# Patient Record
Sex: Female | Born: 1955 | Race: White | Hispanic: No | Marital: Married | Smoking: Never smoker
Health system: Southern US, Community
[De-identification: ages and names within clinical notes are randomized; demographics above are authoritative.]

---

## 2003-04-07 ENCOUNTER — Ambulatory Visit (HOSPITAL_COMMUNITY): Admission: RE | Admit: 2003-04-07 | Discharge: 2003-04-07 | Payer: Self-pay | Admitting: Internal Medicine

## 2005-01-21 ENCOUNTER — Ambulatory Visit (HOSPITAL_COMMUNITY): Admission: RE | Admit: 2005-01-21 | Discharge: 2005-01-21 | Payer: Self-pay | Admitting: Internal Medicine

## 2014-11-22 ENCOUNTER — Other Ambulatory Visit (HOSPITAL_COMMUNITY): Payer: Self-pay | Admitting: Internal Medicine

## 2014-11-22 DIAGNOSIS — Z1231 Encounter for screening mammogram for malignant neoplasm of breast: Secondary | ICD-10-CM

## 2014-12-01 ENCOUNTER — Ambulatory Visit (HOSPITAL_COMMUNITY)
Admission: RE | Admit: 2014-12-01 | Discharge: 2014-12-01 | Disposition: A | Discharge status: Home / Self Care | Payer: BLUE CROSS/BLUE SHIELD | Source: Ambulatory Visit | Attending: Internal Medicine | Admitting: Internal Medicine

## 2014-12-01 DIAGNOSIS — Z1231 Encounter for screening mammogram for malignant neoplasm of breast: Secondary | ICD-10-CM | POA: Diagnosis not present

## 2014-12-21 ENCOUNTER — Other Ambulatory Visit (HOSPITAL_COMMUNITY): Payer: Self-pay | Admitting: Internal Medicine

## 2014-12-21 DIAGNOSIS — M8580 Other specified disorders of bone density and structure, unspecified site: Secondary | ICD-10-CM

## 2014-12-28 ENCOUNTER — Other Ambulatory Visit (HOSPITAL_COMMUNITY): Payer: BLUE CROSS/BLUE SHIELD

## 2016-01-18 ENCOUNTER — Other Ambulatory Visit (HOSPITAL_COMMUNITY): Payer: Self-pay | Admitting: Internal Medicine

## 2016-01-18 DIAGNOSIS — Z1231 Encounter for screening mammogram for malignant neoplasm of breast: Secondary | ICD-10-CM

## 2016-01-19 ENCOUNTER — Emergency Department (HOSPITAL_COMMUNITY)
Admission: EM | Admit: 2016-01-19 | Discharge: 2016-01-19 | Disposition: A | Discharge status: Home / Self Care | Payer: BLUE CROSS/BLUE SHIELD | Attending: Emergency Medicine | Admitting: Emergency Medicine

## 2016-01-19 ENCOUNTER — Emergency Department (HOSPITAL_COMMUNITY): Payer: BLUE CROSS/BLUE SHIELD

## 2016-01-19 ENCOUNTER — Encounter (HOSPITAL_COMMUNITY): Payer: Self-pay | Admitting: Emergency Medicine

## 2016-01-19 DIAGNOSIS — Y9389 Activity, other specified: Secondary | ICD-10-CM | POA: Insufficient documentation

## 2016-01-19 DIAGNOSIS — Y999 Unspecified external cause status: Secondary | ICD-10-CM | POA: Insufficient documentation

## 2016-01-19 DIAGNOSIS — Y92410 Unspecified street and highway as the place of occurrence of the external cause: Secondary | ICD-10-CM | POA: Insufficient documentation

## 2016-01-19 DIAGNOSIS — R1012 Left upper quadrant pain: Secondary | ICD-10-CM | POA: Diagnosis not present

## 2016-01-19 LAB — I-STAT CHEM 8, ED
BUN: 16 mg/dL (ref 6–20)
Calcium, Ion: 1.22 mmol/L (ref 1.12–1.23)
Chloride: 103 mmol/L (ref 101–111)
Creatinine, Ser: 0.7 mg/dL (ref 0.44–1.00)
Glucose, Bld: 104 mg/dL — ABNORMAL HIGH (ref 65–99)
HCT: 41 % (ref 36.0–46.0)
Hemoglobin: 13.9 g/dL (ref 12.0–15.0)
Potassium: 4.3 mmol/L (ref 3.5–5.1)
Sodium: 139 mmol/L (ref 135–145)
TCO2: 25 mmol/L (ref 0–100)

## 2016-01-19 MED ORDER — IOPAMIDOL (ISOVUE-300) INJECTION 61%
100.0000 mL | Freq: Once | INTRAVENOUS | Status: AC | PRN
  Administered 2016-01-19: 100 mL via INTRAVENOUS

## 2016-01-19 MED ORDER — SODIUM CHLORIDE 0.9 % IV BOLUS (SEPSIS)
500.0000 mL | Freq: Once | INTRAVENOUS | Status: AC
  Administered 2016-01-19: 500 mL via INTRAVENOUS
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
## 2016-01-19 NOTE — Discharge Instructions (Signed)
If you were given medicines take as directed.  If you are on coumadin or contraceptives realize their levels and effectiveness is altered by many different medicines.  If you have any reaction (rash, tongues swelling, other) to the medicines stop taking and see a physician.    If your blood pressure was elevated in the ER make sure you follow up for management with a primary doctor or return for chest pain, shortness of breath or stroke symptoms.  Please follow up as directed and return to the ER or see a physician for new or worsening symptoms.  Thank you. Vitals:   01/19/16 1320 01/19/16 1321  BP: 147/73   Pulse: 89   Resp: 16   Temp: 98 F (36.7 C)   TempSrc: Oral   SpO2: 100%   Weight:  170 lb (77.1 kg)  Height:  5\' 6"  (1.676 m)
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
## 2016-01-19 NOTE — ED Notes (Signed)
Patient transported to X-ray 

## 2016-01-21 ENCOUNTER — Encounter (HOSPITAL_COMMUNITY): Payer: Self-pay | Admitting: Emergency Medicine

## 2016-01-21 ENCOUNTER — Emergency Department (HOSPITAL_COMMUNITY)
Admission: EM | Admit: 2016-01-21 | Discharge: 2016-01-21 | Disposition: A | Discharge status: Home / Self Care | Payer: BLUE CROSS/BLUE SHIELD | Attending: Emergency Medicine | Admitting: Emergency Medicine

## 2016-01-21 ENCOUNTER — Emergency Department (HOSPITAL_COMMUNITY): Payer: BLUE CROSS/BLUE SHIELD

## 2016-01-21 DIAGNOSIS — S161XXS Strain of muscle, fascia and tendon at neck level, sequela: Secondary | ICD-10-CM

## 2016-01-21 DIAGNOSIS — M542 Cervicalgia: Secondary | ICD-10-CM | POA: Diagnosis present

## 2016-01-21 DIAGNOSIS — Y999 Unspecified external cause status: Secondary | ICD-10-CM | POA: Diagnosis not present

## 2016-01-21 DIAGNOSIS — S129XXA Fracture of neck, unspecified, initial encounter: Secondary | ICD-10-CM | POA: Diagnosis not present

## 2016-01-21 DIAGNOSIS — Y939 Activity, unspecified: Secondary | ICD-10-CM | POA: Diagnosis not present

## 2016-01-21 DIAGNOSIS — Y92410 Unspecified street and highway as the place of occurrence of the external cause: Secondary | ICD-10-CM | POA: Diagnosis not present

## 2016-01-21 DIAGNOSIS — T148XXA Other injury of unspecified body region, initial encounter: Secondary | ICD-10-CM

## 2016-01-21 MED ORDER — IBUPROFEN 800 MG PO TABS
800.0000 mg | ORAL_TABLET | Freq: Once | ORAL | Status: AC
  Administered 2016-01-21: 800 mg via ORAL
  Filled 2016-01-21: qty 1

## 2016-01-21 MED ORDER — ONDANSETRON HCL 4 MG PO TABS
4.0000 mg | ORAL_TABLET | Freq: Once | ORAL | Status: AC
  Administered 2016-01-21: 4 mg via ORAL
  Filled 2016-01-21: qty 1

## 2016-01-21 MED ORDER — HYDROCODONE-ACETAMINOPHEN 5-325 MG PO TABS: 1.0000 | ORAL_TABLET | ORAL | 0 refills | Status: AC | PRN

## 2016-01-21 MED ORDER — METHOCARBAMOL 500 MG PO TABS: 500.0000 mg | ORAL_TABLET | Freq: Three times a day (TID) | ORAL | 0 refills | Status: AC

## 2016-01-21 MED ORDER — METHOCARBAMOL 500 MG PO TABS
1000.0000 mg | ORAL_TABLET | Freq: Once | ORAL | Status: AC
  Administered 2016-01-21: 1000 mg via ORAL
  Filled 2016-01-21: qty 2
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
## 2016-01-21 NOTE — Discharge Instructions (Signed)
Your x-ray shows evidence of spasm of your neck/cervical area. There is also a tiny avulsion from your C7 vertebrae. Heating pad to the neck and shoulder area will be helpful. Please use Robaxin 3 times daily. Use Norco not improved by Tylenol or ibuprofen. Robaxin and Norco may cause drowsiness, please use these medications with caution. Please see Dr. Margo AyeHall for follow-up and for assistance with your pain control in the office.
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
## 2016-01-22 ENCOUNTER — Ambulatory Visit (HOSPITAL_COMMUNITY): Payer: BLUE CROSS/BLUE SHIELD

## 2016-02-21 ENCOUNTER — Ambulatory Visit (HOSPITAL_COMMUNITY): Payer: BLUE CROSS/BLUE SHIELD | Attending: Neurological Surgery | Admitting: Physical Therapy

## 2016-02-21 DIAGNOSIS — M6281 Muscle weakness (generalized): Secondary | ICD-10-CM | POA: Insufficient documentation

## 2016-02-21 DIAGNOSIS — M542 Cervicalgia: Secondary | ICD-10-CM | POA: Diagnosis not present

## 2016-02-21 DIAGNOSIS — R293 Abnormal posture: Secondary | ICD-10-CM | POA: Diagnosis present

## 2016-02-21 DIAGNOSIS — R252 Cramp and spasm: Secondary | ICD-10-CM | POA: Insufficient documentation
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
## 2016-02-21 NOTE — Patient Instructions (Signed)
   SHOULDER ROLLS  Move your shoulders in a circular pattern as shown so that your are moving in an up, back and down direction. Perform small cicles if needed for comfort.  Only do this exercise backwards, not forwards.  Repeat 10 times, twice a day.      SCAPULAR RETRACTIONS  Draw your shoulder blades back and down. It should feel like you are squeezing your shoulder blades together.  Repeat 10 times, twice a day.   CERVICAL EXCURSIONS  (see separate handout)

## 2016-02-22 ENCOUNTER — Encounter (HOSPITAL_COMMUNITY): Payer: Self-pay

## 2016-02-22 ENCOUNTER — Ambulatory Visit (HOSPITAL_COMMUNITY): Payer: BLUE CROSS/BLUE SHIELD | Admitting: Physical Therapy

## 2016-02-22 DIAGNOSIS — M542 Cervicalgia: Secondary | ICD-10-CM | POA: Diagnosis not present

## 2016-02-22 DIAGNOSIS — R293 Abnormal posture: Secondary | ICD-10-CM

## 2016-02-22 DIAGNOSIS — R252 Cramp and spasm: Secondary | ICD-10-CM

## 2016-02-22 DIAGNOSIS — M6281 Muscle weakness (generalized): Secondary | ICD-10-CM

## 2016-02-22 NOTE — Therapy (Signed)
[facility] [address] [place], [place], [address] Phone: [phone]   Fax:  [phone]  Physical Therapy Treatment  Patient Details  Name: Lisa Collins MRN: [id number] Date of Birth: 1956/01/05 Referring Provider: Loura Halt Ditty   Encounter Date: 02/22/2016      PT End of Session - 02/22/16 1524    Visit Number 2   Number of Visits 12   Date for PT Re-Evaluation 03/13/16   Authorization Type BCBS Other (20 visit limit)   Authorization Time Period 02/21/16 to 04/03/16   Authorization - Visit Number 2   Authorization - Number of Visits 20   PT Start Time 1432   PT Stop Time 1505  moist heat, not included in billing    PT Time Calculation (min) 33 min   Activity Tolerance Patient tolerated treatment well   Behavior During Therapy [facility] for tasks assessed/performed      No past medical history on file.  No past surgical history on file.  There were no vitals filed for this visit.      Subjective Assessment - 02/22/16 1437    Subjective Patient arrives today stating that she has been working on her exercises at home and is really feeling quite sore today, like she got a good workout    Pertinent History no significant PMH or PSH    Currently in Pain? Yes   Pain Score 5    Pain Location Neck   Pain Orientation Posterior   Pain Descriptors / Indicators Sore                         [facility] Adult PT Treatment/Exercise - 02/22/16 0001      Neck Exercises: Seated   Neck Retraction 10 reps   Neck Retraction Limitations cues for form    Other Seated Exercise 3D cervical excursions 1x15   Other Seated Exercise thoracic extensions over chair, lateral flexion, rotations 1x10      Modalities   Modalities Moist Heat     Moist Heat Therapy   Number Minutes Moist Heat 8 Minutes  not included in billing    Moist Heat Location Cervical     Manual Therapy   Manual Therapy Soft tissue mobilization   Manual  therapy comments performed separately from all other skilled services    Soft tissue mobilization B upper traps and cervical extensors                 PT Education - 02/22/16 1523    Education provided Yes   Education Details reviewed initial eval/goals    Person(s) Educated Patient   Methods Explanation   Comprehension Verbalized understanding          PT Short Term Goals - 02/21/16 1534      PT SHORT TERM GOAL #1   Title Patient will demonstrate cervical and thoracic ROM as being WFL on all measured planes in order to reduce pain and improve posture    Time 3   Period Weeks   Status New     PT SHORT TERM GOAL #2   Title Patient to demonstrate bilateral shoulder ROM as being WFL on all measured planes in order to reduce pain and improve functional task performance    Time 3   Period Weeks   Status New     PT SHORT TERM GOAL #3   Title Patient to demonstrate correct posture at least 70% of the  time in order to assist in reducing pain and improving functional task performance    Time 3   Period Weeks   Status New     PT SHORT TERM GOAL #4   Title Patient to be independent in correctly and consistently performing appropriate HEP, to be updated PRN    Time 3   Period Weeks   Status New           PT Long Term Goals - 02/21/16 1536      PT LONG TERM GOAL #1   Title Patient to demonstrate strength at least 4+/5 in all tested muscle groups in order to assist in pain reduction and return to PLOF    Time 6   Period Weeks   Status New     PT LONG TERM GOAL #2   Title Patient to demonstrate correct mechanics for functional floor to overhead lift in order to reduce and prevent exacerbation of cervical pain for safe return to work    Time 6   Period Weeks   Status New     PT LONG TERM GOAL #3   Title Patient to experience pain no more than 1/10 in order to improve general QOL and ability to sleep well at night    Time 6   Period Weeks   Status New     PT  LONG TERM GOAL #4   Title Patient to report she has been able to return to full duty at work with no exacerbation in cervical pain past 1/10 in order to facilitate return to PLOF    Time 6   Period Weeks   Status New               Plan - 02/22/16 1524    Clinical Impression Statement Began session with moist heat pack (not included in billing) to assist in pain relief, followed immediately by manual to bilateral upper traps and cervical extensors today. Otherwise performed postural training and mobility exercises for cervical and thoracic spines with visual feedback and with cues for form today. Reviewed initial eval today at end of session after completing quick disclosure form. Education provided regarding muscle soreness related to exercise and management strategies.    Rehab Potential Good   Clinical Impairments Affecting Rehab Potential very small C7 avulsion fracture    PT Frequency 2x / week   PT Duration 6 weeks   PT Treatment/Interventions ADLs/Self Care Home Management;Biofeedback;Cryotherapy;Moist Heat;Functional mobility training;Therapeutic activities;Therapeutic exercise;Balance training;Neuromuscular re-education;Patient/family education;Manual techniques;Passive range of motion;Energy conservation;Taping   PT Next Visit Plan postural training, cervical and thoracic mobility, manual PRN. Trial VA test and manual traction. Consider adding upper trap and levator stretches to HEP next session.    PT Home Exercise Plan 9/20: scapular retractions, backwards shoulder rolls, 3D cervical excursions    Consulted and Agree with Plan of Care Patient      Patient will benefit from skilled therapeutic intervention in order to improve the following deficits and impairments:  Increased fascial restricitons, Improper body mechanics, Pain, Decreased mobility, Increased muscle spasms, Postural dysfunction, Decreased activity tolerance, Decreased range of motion, Decreased strength,  Hypomobility, Impaired UE functional use, Impaired flexibility  Visit Diagnosis: Cervicalgia  Abnormal posture  Muscle weakness (generalized)  Cramp and spasm     Problem List There are no active problems to display for this patient.   Nedra HaiKristen Jeslie Lowe PT, DPT [phone]  [facility] [address] [place], [place], [address] Phone: [phone]  Fax:  [phone]  Name: Lisa Collins MRN: [id number] Date of Birth: 05-Dec-1955

## 2016-02-23 ENCOUNTER — Ambulatory Visit (HOSPITAL_COMMUNITY): Payer: BLUE CROSS/BLUE SHIELD

## 2016-02-26 ENCOUNTER — Ambulatory Visit (HOSPITAL_COMMUNITY): Payer: BLUE CROSS/BLUE SHIELD | Admitting: Physical Therapy

## 2016-02-26 DIAGNOSIS — R293 Abnormal posture: Secondary | ICD-10-CM

## 2016-02-26 DIAGNOSIS — M6281 Muscle weakness (generalized): Secondary | ICD-10-CM

## 2016-02-26 DIAGNOSIS — M542 Cervicalgia: Secondary | ICD-10-CM

## 2016-02-26 DIAGNOSIS — R252 Cramp and spasm: Secondary | ICD-10-CM

## 2016-02-26 NOTE — Therapy (Signed)
[facility] [address] [place], [place], [address] Phone: [phone]   Fax:  [phone]  Physical Therapy Treatment  Patient Details  Name: Lisa Collins MRN: [id number] Date of Birth: 17-Sep-1955 Referring Provider: Loura Halt Ditty   Encounter Date: 02/26/2016      PT End of Session - 02/26/16 1558    Visit Number 3   Number of Visits 12   Date for PT Re-Evaluation 03/13/16   Authorization Type BCBS Other (20 visit limit)   Authorization Time Period 02/21/16 to 04/03/16   Authorization - Visit Number 3   Authorization - Number of Visits 20   PT Start Time 1516   PT Stop Time 1550  8 minutes of moist heat, not included in billing    PT Time Calculation (min) 34 min   Activity Tolerance Patient tolerated treatment well   Behavior During Therapy [facility] for tasks assessed/performed      No past medical history on file.  No past surgical history on file.  There were no vitals filed for this visit.      Subjective Assessment - 02/26/16 1517    Subjective Patient arrives today stating she has been compliant with HEP and trying to do more around her home, she feels like the manual last session has really helped a lot already    Pertinent History no significant PMH or PSH    Patient Stated Goals wants to get back to work and PLOF    Currently in Pain? No/denies                         [facility] Adult PT Treatment/Exercise - 02/26/16 0001      Neck Exercises: Seated   Other Seated Exercise 3D cervical excursions 1x15   Other Seated Exercise thoracic extensions over chair, lateral flexion, rotations 1x10      Moist Heat Therapy   Number Minutes Moist Heat 8 Minutes  not included in billing    Moist Heat Location Cervical     Manual Therapy   Manual Therapy Soft tissue mobilization   Manual therapy comments performed separately from all other skilled services    Soft tissue mobilization B upper traps and  cervical extensors      Neck Exercises: Stretches   Upper Trapezius Stretch 2 reps;30 seconds   Levator Stretch 2 reps;30 seconds   Corner Stretch 2 reps;30 seconds                PT Education - 02/26/16 1558    Education provided Yes   Education Details HEP update    Person(s) Educated Patient   Methods Explanation;Handout   Comprehension Verbalized understanding;Need further instruction          PT Short Term Goals - 02/21/16 1534      PT SHORT TERM GOAL #1   Title Patient will demonstrate cervical and thoracic ROM as being WFL on all measured planes in order to reduce pain and improve posture    Time 3   Period Weeks   Status New     PT SHORT TERM GOAL #2   Title Patient to demonstrate bilateral shoulder ROM as being WFL on all measured planes in order to reduce pain and improve functional task performance    Time 3   Period Weeks   Status New     PT SHORT TERM GOAL #3   Title Patient to demonstrate correct posture at least  70% of the time in order to assist in reducing pain and improving functional task performance    Time 3   Period Weeks   Status New     PT SHORT TERM GOAL #4   Title Patient to be independent in correctly and consistently performing appropriate HEP, to be updated PRN    Time 3   Period Weeks   Status New           PT Long Term Goals - 02/21/16 1536      PT LONG TERM GOAL #1   Title Patient to demonstrate strength at least 4+/5 in all tested muscle groups in order to assist in pain reduction and return to PLOF    Time 6   Period Weeks   Status New     PT LONG TERM GOAL #2   Title Patient to demonstrate correct mechanics for functional floor to overhead lift in order to reduce and prevent exacerbation of cervical pain for safe return to work    Time 6   Period Weeks   Status New     PT LONG TERM GOAL #3   Title Patient to experience pain no more than 1/10 in order to improve general QOL and ability to sleep well at night     Time 6   Period Weeks   Status New     PT LONG TERM GOAL #4   Title Patient to report she has been able to return to full duty at work with no exacerbation in cervical pain past 1/10 in order to facilitate return to PLOF    Time 6   Period Weeks   Status New               Plan - 02/26/16 1733    Clinical Impression Statement Began session with moist heat (not included in billing) to cervical area/upper traps, followed immediately by manual to bilateral upper traps and cervical musculature. Introduced functional cervical and UE stretching today with good tolerance by patient and cues for form, followed by thoracic/cervical mobility exercises today. Verbal cues provided for form this session. Patient appears very compliant with HEP, and recommend possible HEP update within upcoming sessions as long as patient is able to perform with good form.    Rehab Potential Good   Clinical Impairments Affecting Rehab Potential very small C7 avulsion fracture    PT Frequency 2x / week   PT Duration 6 weeks   PT Treatment/Interventions ADLs/Self Care Home Management;Biofeedback;Cryotherapy;Moist Heat;Functional mobility training;Therapeutic activities;Therapeutic exercise;Balance training;Neuromuscular re-education;Patient/family education;Manual techniques;Passive range of motion;Energy conservation;Taping   PT Next Visit Plan moist heat/manual first, then functional stretching/mobiity, postural training; add upper trap, corner, and levator stretches to HEP    PT Home Exercise Plan 9/25: scapular retractions, backwards shoulder rolls, 3D cervical/thoracic excursions    Consulted and Agree with Plan of Care Patient      Patient will benefit from skilled therapeutic intervention in order to improve the following deficits and impairments:  Increased fascial restricitons, Improper body mechanics, Pain, Decreased mobility, Increased muscle spasms, Postural dysfunction, Decreased activity tolerance,  Decreased range of motion, Decreased strength, Hypomobility, Impaired UE functional use, Impaired flexibility  Visit Diagnosis: Cervicalgia  Abnormal posture  Muscle weakness (generalized)  Cramp and spasm     Problem List There are no active problems to display for this patient.    Nedra HaiKristen Olanda Boughner PT, DPT [phone]  [facility] [address] [place], [place], [address] Phone:  [phone]   Fax:  [phone]  Name: JOHNNYE SANDFORD MRN: [id number] Date of Birth: Nov 20, 1955

## 2016-02-28 ENCOUNTER — Ambulatory Visit (HOSPITAL_COMMUNITY): Payer: BLUE CROSS/BLUE SHIELD | Admitting: Physical Therapy

## 2016-02-28 DIAGNOSIS — M542 Cervicalgia: Secondary | ICD-10-CM | POA: Diagnosis not present

## 2016-02-28 DIAGNOSIS — M6281 Muscle weakness (generalized): Secondary | ICD-10-CM

## 2016-02-28 DIAGNOSIS — R293 Abnormal posture: Secondary | ICD-10-CM

## 2016-02-28 DIAGNOSIS — R252 Cramp and spasm: Secondary | ICD-10-CM

## 2016-02-28 NOTE — Therapy (Signed)
[place], [place], [address] Phone: [phone]   Fax:  [phone]  Physical Therapy Treatment  Patient Details  Name: Lisa Collins MRN: [id number] Date of Birth: 01-Jun-1956 Referring Provider: Kevan Ny Ditty   Encounter Date: 02/28/2016      PT End of Session - 02/28/16 1512    Visit Number 4   Number of Visits 12   Date for PT Re-Evaluation 03/13/16   Authorization Type BCBS Other (20 visit limit)   Authorization Time Period 02/21/16 to 04/03/16   Authorization - Visit Number 4   Authorization - Number of Visits 20   PT Start Time 1430   PT Stop Time 1504  8 minutes moist heat, not included in billing    PT Time Calculation (min) 34 min   Activity Tolerance Patient tolerated treatment well   Behavior During Therapy [facility] for tasks assessed/performed      No past medical history on file.  No past surgical history on file.  There were no vitals filed for this visit.      Subjective Assessment - 02/28/16 1432    Subjective Patient arrives today stating that she is extremely sore today, it is a sharp pain rather than workout pain. She states she only did her exercises and states that she did not sleep good due to her pillow giving her trouble.  Nothing else major going on.    Pertinent History no significant PMH or PSH    Patient Stated Goals wants to get back to work and PLOF    Currently in Pain? Yes   Pain Score 5    Pain Location Neck   Pain Orientation Right;Left   Pain Descriptors / Indicators Sore   Pain Type Chronic pain   Pain Radiating Towards radiating down into shoulders    Pain Onset 1 to 4 weeks ago   Pain Frequency Constant   Aggravating Factors  moving around in general    Pain Relieving Factors medications    Effect of Pain on Daily Activities cannot return to work or PLOF                          [facility] Adult PT Treatment/Exercise - 02/28/16 0001      Moist Heat Therapy   Number Minutes Moist Heat 8 Minutes  not included in billing    Moist Heat Location Cervical     Manual Therapy   Manual Therapy Soft tissue mobilization;Muscle Energy Technique;Myofascial release   Manual therapy comments performed separately from all other skilled services    Soft tissue mobilization B upper traps and cervical extensors    Myofascial Release suboccipital release 5x30 seconds B    Muscle Energy Technique upper traps B                 PT Education - 02/28/16 1512    Education provided Yes   Education Details get a new pillow, compliance with HEP and moist heat, lumbar roll for posture    Person(s) Educated Patient   Methods Explanation   Comprehension Verbalized understanding          PT Short Term Goals - 02/21/16 1534      PT SHORT TERM GOAL #1   Title Patient will demonstrate cervical and thoracic ROM as being WFL on all measured planes in order to reduce pain and improve posture    Time 3   Period  Weeks   Status New     PT SHORT TERM GOAL #2   Title Patient to demonstrate bilateral shoulder ROM as being WFL on all measured planes in order to reduce pain and improve functional task performance    Time 3   Period Weeks   Status New     PT SHORT TERM GOAL #3   Title Patient to demonstrate correct posture at least 70% of the time in order to assist in reducing pain and improving functional task performance    Time 3   Period Weeks   Status New     PT SHORT TERM GOAL #4   Title Patient to be independent in correctly and consistently performing appropriate HEP, to be updated PRN    Time 3   Period Weeks   Status New           PT Long Term Goals - 02/21/16 1536      PT LONG TERM GOAL #1   Title Patient to demonstrate strength at least 4+/5 in all tested muscle groups in order to assist in pain reduction and return to PLOF    Time 6   Period Weeks   Status New     PT LONG TERM GOAL #2   Title Patient to  demonstrate correct mechanics for functional floor to overhead lift in order to reduce and prevent exacerbation of cervical pain for safe return to work    Time 6   Period Weeks   Status New     PT LONG TERM GOAL #3   Title Patient to experience pain no more than 1/10 in order to improve general QOL and ability to sleep well at night    Time 6   Period Weeks   Status New     PT LONG TERM GOAL #4   Title Patient to report she has been able to return to full duty at work with no exacerbation in cervical pain past 1/10 in order to facilitate return to PLOF    Time [place] - 02/28/16 1513    Clinical Impression Statement Patient arrives today complaining of increased pain today, relating it possibly to the way she slept last night; began session with moist heat (not included in billing) and focused primarily on manual this session for pain relief. Continued with STM to bilateral cervical extensors, upper traps, and scapular stabilizers, also introduced MET to upper traps and sub-occipital release. No decrease in pain at end of session, however patient does report improvement in quality of pain today following manual.    Rehab Potential Good   Clinical Impairments Affecting Rehab Potential very small C7 avulsion fracture    PT Frequency 2x / week   PT Duration 6 weeks   PT Treatment/Interventions ADLs/Self Care Home Management;Biofeedback;Cryotherapy;Moist Heat;Functional mobility training;Therapeutic activities;Therapeutic exercise;Balance training;Neuromuscular re-education;Patient/family education;Manual techniques;Passive range of motion;Energy conservation;Taping   PT Next Visit Plan moist heat/manual first, then functional stretching/mobiity, postural training; add upper trap, corner, and levator stretches to HEP    PT Home Exercise Plan 9/25: scapular retractions, backwards shoulder rolls, 3D cervical/thoracic excursions    Consulted and  Agree with Plan of Care Patient      Patient will benefit from skilled therapeutic intervention in order to improve the following deficits and impairments:  Increased fascial restricitons, Improper body mechanics, Pain, Decreased mobility, Increased muscle spasms, Postural  dysfunction, Decreased activity tolerance, Decreased range of motion, Decreased strength, Hypomobility, Impaired UE functional use, Impaired flexibility  Visit Diagnosis: Cervicalgia  Abnormal posture  Muscle weakness (generalized)  Cramp and spasm     Problem List There are no active problems to display for this patient.   Deniece Ree PT, DPT [phone]  [place] [address] [place], [place], [address] Phone: [phone]   Fax:  [phone]  Name: Lisa Collins MRN: [id number] Date of Birth: 07/28/1955

## 2016-03-04 ENCOUNTER — Ambulatory Visit (HOSPITAL_COMMUNITY): Payer: BLUE CROSS/BLUE SHIELD | Attending: Neurological Surgery | Admitting: Physical Therapy

## 2016-03-04 DIAGNOSIS — R293 Abnormal posture: Secondary | ICD-10-CM | POA: Diagnosis present

## 2016-03-04 DIAGNOSIS — M6281 Muscle weakness (generalized): Secondary | ICD-10-CM | POA: Insufficient documentation

## 2016-03-04 DIAGNOSIS — R252 Cramp and spasm: Secondary | ICD-10-CM | POA: Diagnosis present

## 2016-03-04 DIAGNOSIS — M542 Cervicalgia: Secondary | ICD-10-CM | POA: Diagnosis present

## 2016-03-04 NOTE — Therapy (Signed)
[facility] [address] [place], [place], [address] Phone: [phone]   Fax:  [phone]  Physical Therapy Treatment  Patient Details  Name: Lisa DibbleDonna W Collins MRN: [id number] Date of Birth: 11/19/1955 Referring Provider: Loura HaltBenjamin Jared Ditty   Encounter Date: 03/04/2016      PT End of Session - 03/04/16 1520    Visit Number 5   Number of Visits 12   Date for PT Re-Evaluation 03/13/16   Authorization Type BCBS Other (20 visit limit)   Authorization Time Period 02/21/16 to 04/03/16   Authorization - Visit Number 5   Authorization - Number of Visits 20   PT Start Time 1433   PT Stop Time 1507  moist heat, not included in billing    PT Time Calculation (min) 34 min   Activity Tolerance Patient tolerated treatment well   Behavior During Therapy [name] for tasks assessed/performed      No past medical history on file.  No past surgical history on file.  There were no vitals filed for this visit.      Subjective Assessment - 03/04/16 1435    Subjective Patient arrives today stating that she is feeling better; she is very compliant with her exercises and states she got a new pillow which is really seeming to help. Nothing else major going on.    Pertinent History no significant PMH or PSH    Patient Stated Goals wants to get back to work and PLOF    Currently in Pain? Yes   Pain Score 3    Pain Location Neck   Pain Orientation Left   Pain Descriptors / Indicators Sore   Pain Type Chronic pain   Pain Radiating Towards radiating down into shoulder    Pain Onset More than a month ago   Pain Frequency Constant   Aggravating Factors  moving around in general    Pain Relieving Factors medication    Effect of Pain on Daily Activities still not back at work             [facility] - 03/04/16 0001      Observation/Other Assessments   Observations (+) for thoracic outlet syndrome symptoms L UE with corner stretch, unable to  complete ROOS test; (+) for VA test with passive extension (did not perform combination extension and rotation)                     [facility] Adult PT Treatment/Exercise - 03/04/16 0001      Neck Exercises: Seated   Neck Retraction 10 reps   Neck Retraction Limitations 3 second hold    Other Seated Exercise 3D cervical excursions 1x15     Moist Heat Therapy   Number Minutes Moist Heat 8 Minutes  not included in billing    Moist Heat Location Cervical     Manual Therapy   Manual Therapy Myofascial release   Manual therapy comments performed separately from all other skilled services    Myofascial Release suboccipital release 5x30 seconds B      Neck Exercises: Stretches   Upper Trapezius Stretch 2 reps;30 seconds   Levator Stretch 2 reps;30 seconds   Corner Stretch 2 reps;30 seconds                PT Education - 03/04/16 1519    Education provided Yes   Education Details stretch form/tactile cues throughout session, keep up with HEP    Person(s) Educated Patient  Methods Explanation;Tactile cues   Comprehension Verbalized understanding          PT Short Term Goals - 02/21/16 1534      PT SHORT TERM GOAL #1   Title Patient will demonstrate cervical and thoracic ROM as being WFL on all measured planes in order to reduce pain and improve posture    Time 3   Period Weeks   Status New     PT SHORT TERM GOAL #2   Title Patient to demonstrate bilateral shoulder ROM as being WFL on all measured planes in order to reduce pain and improve functional task performance    Time 3   Period Weeks   Status New     PT SHORT TERM GOAL #3   Title Patient to demonstrate correct posture at least 70% of the time in order to assist in reducing pain and improving functional task performance    Time 3   Period Weeks   Status New     PT SHORT TERM GOAL #4   Title Patient to be independent in correctly and consistently performing appropriate HEP, to be updated PRN     Time 3   Period Weeks   Status New           PT Long Term Goals - 02/21/16 1536      PT LONG TERM GOAL #1   Title Patient to demonstrate strength at least 4+/5 in all tested muscle groups in order to assist in pain reduction and return to PLOF    Time 6   Period Weeks   Status New     PT LONG TERM GOAL #2   Title Patient to demonstrate correct mechanics for functional floor to overhead lift in order to reduce and prevent exacerbation of cervical pain for safe return to work    Time 6   Period Weeks   Status New     PT LONG TERM GOAL #3   Title Patient to experience pain no more than 1/10 in order to improve general QOL and ability to sleep well at night    Time 6   Period Weeks   Status New     PT LONG TERM GOAL #4   Title Patient to report she has been able to return to full duty at work with no exacerbation in cervical pain past 1/10 in order to facilitate return to PLOF    Time 6   Period Weeks   Status New               Plan - 03/04/16 1521    Clinical Impression Statement Patient arrives today stating that she is feeling much better; she got a new pillow over the weekend and has been keeping up with her HEP, and is starting to feel much better. Began session as always with moist heat (not included in billing), and then proceeded to continue with functional stretching and postural training today. Did note positive thoracic outlet syndrome signs with corner stretch (loss of blood flow and temperature/paleness L UE), as well as positive VA sign with passive extension today (performed as a precaution before considering joint mobilizations to cervical spine). Patient continues to require tactile cues for proper form with stretches, as well as verbal cues for form with chin tucks. Advised to begin chin tuck holds at home to progress this exercise.    Rehab Potential Good   Clinical Impairments Affecting Rehab Potential very small C7 avulsion fracture    PT  Frequency 2x  / week   PT Duration 6 weeks   PT Treatment/Interventions ADLs/Self Care Home Management;Biofeedback;Cryotherapy;Moist Heat;Functional mobility training;Therapeutic activities;Therapeutic exercise;Balance training;Neuromuscular re-education;Patient/family education;Manual techniques;Passive range of motion;Energy conservation;Taping   PT Next Visit Plan moist heat/manual first, then exercise/postural training; advanced postural strengthening HEP . Trial prone exercises and reaching overhead with chin tuck.    PT Home Exercise Plan 9/25: scapular retractions, backwards shoulder rolls, 3D cervical/thoracic excursions    Consulted and Agree with Plan of Care Patient      Patient will benefit from skilled therapeutic intervention in order to improve the following deficits and impairments:  Increased fascial restricitons, Improper body mechanics, Pain, Decreased mobility, Increased muscle spasms, Postural dysfunction, Decreased activity tolerance, Decreased range of motion, Decreased strength, Hypomobility, Impaired UE functional use, Impaired flexibility  Visit Diagnosis: Cervicalgia  Abnormal posture  Muscle weakness (generalized)  Cramp and spasm     Problem List There are no active problems to display for this patient.   Nedra Hai PT, DPT [phone]  [facility] [address] [place], [place], [address] Phone: [phone]   Fax:  [phone]  Name: Lisa Collins MRN: [id number] Date of Birth: August 19, 1955

## 2016-03-06 ENCOUNTER — Ambulatory Visit (HOSPITAL_COMMUNITY): Payer: BLUE CROSS/BLUE SHIELD

## 2016-03-06 DIAGNOSIS — R293 Abnormal posture: Secondary | ICD-10-CM

## 2016-03-06 DIAGNOSIS — M542 Cervicalgia: Secondary | ICD-10-CM

## 2016-03-06 DIAGNOSIS — R252 Cramp and spasm: Secondary | ICD-10-CM

## 2016-03-06 DIAGNOSIS — M6281 Muscle weakness (generalized): Secondary | ICD-10-CM

## 2016-03-06 NOTE — Therapy (Signed)
[place] [facility] [address] [place], [place], [address] Phone: [phone]   Fax:  [phone]  Physical Therapy Treatment  Patient Details  Name: Lisa Collins MRN: [id number] Date of Birth: 05-Jul-1955 Referring Provider: Loura Halt Ditty   Encounter Date: 03/06/2016      PT End of Session - 03/06/16 1447    Visit Number 6   Number of Visits 12   Date for PT Re-Evaluation 03/13/16   Authorization Type BCBS Other (20 visit limit)   Authorization Time Period 02/21/16 to 04/03/16   Authorization - Visit Number 6   Authorization - Number of Visits 20   PT Start Time 1435   PT Stop Time 1514   PT Time Calculation (min) 39 min   Activity Tolerance Patient tolerated treatment well   Behavior During Therapy [facility] for tasks assessed/performed      No past medical history on file.  No past surgical history on file.  There were no vitals filed for this visit.      Subjective Assessment - 03/06/16 1437    Subjective Pt stated she feels her neck is doing better, has been compliant with HEP daily.  Current pain scale 3-4/10    Pertinent History no significant PMH or PSH    Patient Stated Goals wants to get back to work and PLOF    Currently in Pain? Yes   Pain Score 3    Pain Location Neck   Pain Orientation Left   Pain Descriptors / Indicators Sharp;Sore   Pain Type Chronic pain   Pain Radiating Towards no radicular symptoms today (was radiating down into shoulder eval)   Pain Onset More than a month ago   Pain Frequency Constant   Aggravating Factors  moving around in general   Pain Relieving Factors medication   Effect of Pain on Daily Activities still not back at work          [facility] Adult PT Treatment/Exercise - 03/06/16 0001      Neck Exercises: Standing   Upper Extremity Flexion with Stabilization 10 reps;Flexion     Neck Exercises: Seated   Neck Retraction 15 reps   Neck Retraction Limitations 3 second hold    W  Back Limitations 10   Other Seated Exercise 3D cervical excursions 1x15     Moist Heat Therapy   Number Minutes Moist Heat 8 Minutes  during cervical therex   Moist Heat Location Cervical     Manual Therapy   Manual Therapy Myofascial release;Soft tissue mobilization   Manual therapy comments performed separately from all other skilled services    Soft tissue mobilization Bil upper traps, scalenes, supraspinatus   Myofascial Release suboccipital release 5x30 seconds B                   PT Short Term Goals - 02/21/16 1534      PT SHORT TERM GOAL #1   Title Patient will demonstrate cervical and thoracic ROM as being WFL on all measured planes in order to reduce pain and improve posture    Time 3   Period Weeks   Status New     PT SHORT TERM GOAL #2   Title Patient to demonstrate bilateral shoulder ROM as being WFL on all measured planes in order to reduce pain and improve functional task performance    Time 3   Period Weeks   Status New     PT SHORT TERM GOAL #3  Title Patient to demonstrate correct posture at least 70% of the time in order to assist in reducing pain and improving functional task performance    Time 3   Period Weeks   Status New     PT SHORT TERM GOAL #4   Title Patient to be independent in correctly and consistently performing appropriate HEP, to be updated PRN    Time 3   Period Weeks   Status New           PT Long Term Goals - 02/21/16 1536      PT LONG TERM GOAL #1   Title Patient to demonstrate strength at least 4+/5 in all tested muscle groups in order to assist in pain reduction and return to PLOF    Time 6   Period Weeks   Status New     PT LONG TERM GOAL #2   Title Patient to demonstrate correct mechanics for functional floor to overhead lift in order to reduce and prevent exacerbation of cervical pain for safe return to work    Time 6   Period Weeks   Status New     PT LONG TERM GOAL #3   Title Patient to experience  pain no more than 1/10 in order to improve general QOL and ability to sleep well at night    Time 6   Period Weeks   Status New     PT LONG TERM GOAL #4   Title Patient to report she has been able to return to full duty at work with no exacerbation in cervical pain past 1/10 in order to facilitate return to PLOF    Time 6   Period Weeks   Status New               Plan - 03/06/16 1532    Clinical Impression Statement Session focus on improving cervical mobility, posture strengthening and manual technqiues to reduce spasms for pain control.  Added chin tucks with UE flexion for posutre strengthening with good form noted following initial demonstration.  Ended session with manual to Bil upper traps, scalenes and supraspinatus to reduce spasms and fascial restrictions, able to reduce though unable to fully resolve..  Noted tightness suboccipitual region as well.  EOS no reports of increased pain, pt was encouraged to stay hydrated following manual.     Rehab Potential Good   Clinical Impairments Affecting Rehab Potential very small C7 avulsion fracture    PT Frequency 2x / week   PT Duration 6 weeks   PT Treatment/Interventions ADLs/Self Care Home Management;Biofeedback;Cryotherapy;Moist Heat;Functional mobility training;Therapeutic activities;Therapeutic exercise;Balance training;Neuromuscular re-education;Patient/family education;Manual techniques;Passive range of motion;Energy conservation;Taping   PT Next Visit Plan moist heat/manual first, then exercise/postural training; advanced postural strengthening HEP . Trial prone exercises and continue reaching overhead with chin tuck.    PT Home Exercise Plan 9/25: scapular retractions, backwards shoulder rolls, 3D cervical/thoracic excursions       Patient will benefit from skilled therapeutic intervention in order to improve the following deficits and impairments:  Increased fascial restricitons, Improper body mechanics, Pain, Decreased  mobility, Increased muscle spasms, Postural dysfunction, Decreased activity tolerance, Decreased range of motion, Decreased strength, Hypomobility, Impaired UE functional use, Impaired flexibility  Visit Diagnosis: Cervicalgia  Abnormal posture  Muscle weakness (generalized)  Cramp and spasm     Problem List There are no active problems to display for this patient.  [address], LPTA; CBIS [phone]  Juel Burrow 03/06/2016, 3:59 PM  [place] [facility]  [facility] [address] [place], [place], [address] Phone: [phone]   Fax:  [phone]  Name: Lisa DibbleDonna W Collins MRN: [id number] Date of Birth: 04/23/1956

## 2016-03-11 ENCOUNTER — Ambulatory Visit (HOSPITAL_COMMUNITY): Payer: BLUE CROSS/BLUE SHIELD | Admitting: Physical Therapy

## 2016-03-11 DIAGNOSIS — M542 Cervicalgia: Secondary | ICD-10-CM | POA: Diagnosis not present

## 2016-03-11 DIAGNOSIS — R293 Abnormal posture: Secondary | ICD-10-CM

## 2016-03-11 DIAGNOSIS — R252 Cramp and spasm: Secondary | ICD-10-CM

## 2016-03-11 DIAGNOSIS — M6281 Muscle weakness (generalized): Secondary | ICD-10-CM

## 2016-03-11 NOTE — Therapy (Signed)
[place], [place], [address] Phone: [phone]   Fax:  [phone]  Physical Therapy Treatment (Re-Assessment)  Patient Details  Name: Lisa Collins MRN: [id number] Date of Birth: Feb 22, 1956 Referring Provider: Kevan Ny Ditty   Encounter Date: 03/11/2016      PT End of Session - 03/11/16 1517    Visit Number 7   Number of Visits 13   Date for PT Re-Evaluation 04/01/16   Authorization Type BC[employer] Other (20 visit limit)   Authorization Time Period 02/21/16 to 04/03/16   Authorization - Visit Number 7   Authorization - Number of Visits 20   PT Start Time 2458   PT Stop Time 1510   PT Time Calculation (min) 38 min   Activity Tolerance Patient tolerated treatment well   Behavior During Therapy [facility] for tasks assessed/performed      No past medical history on file.  No past surgical history on file.  There were no vitals filed for this visit.      Subjective Assessment - 03/11/16 1434    Subjective Patient arrives today stating she feels like she overdid it a bit this past weekend; she was feeling so good that she just overdid things. She is feeling good overall- it has gotten easier for her to get more active and do her housework. She still feels sore in general but she is able to push through; she feels like she would still be quite sore from going back to work at this point. She rates herself as being around 85%; that last 15% is just feeling sore and sometimes getting shooting pains when she is lifting things up.    Pertinent History no significant PMH or PSH    How long can you sit comfortably? 10/9- not sure    How long can you stand comfortably? 10/9- feels like she could wash dishes/cook, not sure about time limitations    How long can you walk comfortably? 10/9- unlimited   Patient Stated Goals wants to get back to work and PLOF    Currently in Pain? Yes   Pain Score 2    Pain Location Neck   Pain Orientation Right;Left;Posterior   Pain Descriptors / Indicators Sharp;Shooting   Pain Type Chronic pain   Pain Radiating Towards little bit into shoulders    Pain Onset More than a month ago   Pain Frequency Intermittent   Aggravating Factors  using UEs    Pain Relieving Factors heat packs, medication    Effect of Pain on Daily Activities still not back at work             [facility] PT Assessment - 03/11/16 0001      Observation/Other Assessments   Focus on Therapeutic Outcomes (FOTO)  49% limited (was 67% limited)     AROM   Cervical Flexion 50   Cervical Extension 30   Cervical - Right Side Bend 30   Cervical - Left Side Bend 26   Cervical - Right Rotation 60   Cervical - Left Rotation 56   Thoracic Flexion [facility]   Thoracic Extension min limitation    Thoracic - Right Side Bend moderation limitaiton    Thoracic - Left Side Bend moderate limitation    Thoracic - Right Rotation moderate limitation    Thoracic - Left Rotation moderate limitation      Strength   Right Shoulder Flexion 4/5   Right Shoulder ABduction 4-/5   Right  Shoulder Internal Rotation 4/5   Right Shoulder External Rotation 4+/5   Left Shoulder Flexion 4/5   Left Shoulder ABduction 3+/5   Left Shoulder Internal Rotation 4+/5   Left Shoulder External Rotation 4+/5   Cervical Flexion 3+/5   Cervical Extension 3+/5   Cervical - Right Side Bend 3-/5   Cervical - Left Side Bend 3-/5     Palpation   Palpation comment severe muscle knotting throughout occipital muscles, B upper taps, scapular stabilizer muscles                     [facility] Adult PT Treatment/Exercise - 03/11/16 0001      Neck Exercises: Seated   Other Seated Exercise thoracic extensions over chair 1x15      Neck Exercises: Supine   Other Supine Exercise static thoracic extension over towel roll 3 minutes                 PT Education - 03/11/16 1517    Education provided Yes   Education Details POC, progress  thus far, no changes to HEP today, impact of thoracic mobility on cervical function    Person(s) Educated Patient   Methods Explanation   Comprehension Verbalized understanding          PT Short Term Goals - 03/11/16 1454      PT SHORT TERM GOAL #1   Title Patient will demonstrate cervical and thoracic ROM as being WFL on all measured planes in order to reduce pain and improve posture    Baseline 10/9- improving    Time 3   Period Weeks   Status On-going     PT SHORT TERM GOAL #2   Title Patient to demonstrate bilateral shoulder ROM as being [facility] on all measured planes in order to reduce pain and improve functional task performance    Baseline 10/9- WFL for flexion/ABD, but remain stiff with functional IR/ER   Time 3   Period Weeks   Status Partially Met     PT SHORT TERM GOAL #3   Title Patient to demonstrate correct posture at least 70% of the time in order to assist in reducing pain and improving functional task performance    Baseline 10/9- improving    Time 3   Period Weeks   Status On-going     PT SHORT TERM GOAL #4   Title Patient to be independent in correctly and consistently performing appropriate HEP, to be updated PRN    Baseline 10/9- adherent    Time 3   Period Weeks   Status Achieved           PT Long Term Goals - 03/11/16 1456      PT LONG TERM GOAL #1   Title Patient to demonstrate strength at least 4+/5 in all tested muscle groups in order to assist in pain reduction and return to PLOF    Baseline 10/9- remains weak, possibly due to pain inhibition    Time 6   Period Weeks   Status On-going     PT LONG TERM GOAL #2   Title Patient to demonstrate correct mechanics for functional floor to overhead lift in order to reduce and prevent exacerbation of cervical pain for safe return to work    Time 6   Period Weeks   Status On-going     PT LONG TERM GOAL #3   Title Patient to experience pain no more than 1/10 in order to improve general QOL  and  ability to sleep well at night    Baseline 10/9- pain can still get to 3-4/10; she continues to wake up due to pain at night    Time 6   Period Weeks   Status On-going     PT LONG TERM GOAL #4   Title Patient to report she has been able to return to full duty at work with no exacerbation in cervical pain past 1/10 in order to facilitate return to PLOF    Time 6   Period Weeks   Status On-going               Plan - 03/11/16 1518    Clinical Impression Statement Re-assessment performed today. Patient does appear to be making progress towards her goals at this point, with improvements in cervical ROM and general reduction in pain, however patient does continue to demonstrate functional limitations in cervical/thoracic ROM, posture, functional strength, functional lifting mechanics, and tolerance to functional tasks at this time. Patient does continue to demonstrate increased dizziness/lightheadedness with cervical extension, indicating possible VA impairment and she is contraindicated to manual per these findings. Recommend extension of skilled PT services to continue working on functional impairments and assist in reaching optimal level of function.    Rehab Potential Good   Clinical Impairments Affecting Rehab Potential very small C7 avulsion fracture    PT Frequency 2x / week   PT Duration 3 weeks   PT Treatment/Interventions ADLs/Self Care Home Management;Biofeedback;Cryotherapy;Moist Heat;Functional mobility training;Therapeutic activities;Therapeutic exercise;Balance training;Neuromuscular re-education;Patient/family education;Manual techniques;Passive range of motion;Energy conservation;Taping   PT Next Visit Plan manual first, then more aggressive postural training/ ther ex; functional lifting with cervical retraction. Prone exercises. AVOID JOINT MOBS/POSITIONS WITH EXTENDED CERVICAL EXTENSION DUE TO POSITIVE VA SIGNS.    PT Home Exercise Plan 9/25: scapular retractions, backwards  shoulder rolls, 3D cervical/thoracic excursions    Consulted and Agree with Plan of Care Patient      Patient will benefit from skilled therapeutic intervention in order to improve the following deficits and impairments:  Increased fascial restricitons, Improper body mechanics, Pain, Decreased mobility, Increased muscle spasms, Postural dysfunction, Decreased activity tolerance, Decreased range of motion, Decreased strength, Hypomobility, Impaired UE functional use, Impaired flexibility  Visit Diagnosis: Cervicalgia  Abnormal posture  Muscle weakness (generalized)  Cramp and spasm     Problem List There are no active problems to display for this patient.   Deniece Ree PT, DPT [phone]  [place] [address] [place], [place], [address] Phone: [phone]   Fax:  [phone]  Name: MICHON KACZMAREK MRN: [id number] Date of Birth: 1955-10-27

## 2016-03-13 ENCOUNTER — Ambulatory Visit (HOSPITAL_COMMUNITY): Payer: BLUE CROSS/BLUE SHIELD | Admitting: Physical Therapy

## 2016-03-13 DIAGNOSIS — M542 Cervicalgia: Secondary | ICD-10-CM | POA: Diagnosis not present

## 2016-03-13 DIAGNOSIS — M6281 Muscle weakness (generalized): Secondary | ICD-10-CM

## 2016-03-13 DIAGNOSIS — R252 Cramp and spasm: Secondary | ICD-10-CM

## 2016-03-13 DIAGNOSIS — R293 Abnormal posture: Secondary | ICD-10-CM

## 2016-03-13 NOTE — Therapy (Signed)
[place], [place], [address] Phone: [phone]   Fax:  [phone]  Physical Therapy Treatment  Patient Details  Name: Lisa Collins MRN: [id number] Date of Birth: [birth year] Referring Provider: Kevan Ny Ditty   Encounter Date: 03/13/2016      PT End of Session - 03/13/16 1800    Visit Number 8   Number of Visits 13   Date for PT Re-Evaluation 04/01/16   Authorization Type BCBS Other (20 visit limit)   Authorization Time Period 02/21/16 to 04/03/16   Authorization - Visit Number 8   Authorization - Number of Visits 20   PT Start Time 1115  started with moist heat pack, not included in billing    PT Stop Time 1513   PT Time Calculation (min) 31 min   Activity Tolerance Patient tolerated treatment well   Behavior During Therapy [facility] for tasks assessed/performed      No past medical history on file.  No past surgical history on file.  There were no vitals filed for this visit.      Subjective Assessment - 03/13/16 1439    Subjective Patient arrives today stating that she is feeling OK but does notice some extra muscle tightness and knottting; she is excited to finally have gotten a new car    Pertinent History no significant PMH or PSH    Currently in Pain? Yes   Pain Score 2    Pain Location Neck                         [facility] Adult PT Treatment/Exercise - 03/13/16 0001      Neck Exercises: Standing   Other Standing Exercises all in standing with cervical retraction: B UE flexion 1x15; rotations 1x15; lateral flexion 1x15; B UE flexion with 1000g ball      Neck Exercises: Seated   Neck Retraction 15 reps   Neck Retraction Limitations 3 second hold      Moist Heat Therapy   Number Minutes Moist Heat 8 Minutes  not included in billing    Moist Heat Location Cervical     Manual Therapy   Manual Therapy Soft tissue mobilization   Manual therapy comments performed  separately from all other skilled services    Soft tissue mobilization Bil upper traps, supraspinatus, levator, occipitals, rhomboids                 PT Education - 03/13/16 1800    Education provided Yes   Education Details encouraged patient to add retactions with lateral flexion and rotation to HEP (progression of just basic chin tuck)   Person(s) Educated Patient   Methods Explanation;Demonstration;Verbal cues   Comprehension Verbalized understanding;Returned demonstration;Need further instruction          PT Short Term Goals - 03/11/16 1454      PT SHORT TERM GOAL #1   Title Patient will demonstrate cervical and thoracic ROM as being [facility] on all measured planes in order to reduce pain and improve posture    Baseline 10/9- improving    Time 3   Period Weeks   Status On-going     PT SHORT TERM GOAL #2   Title Patient to demonstrate bilateral shoulder ROM as being [facility] on all measured planes in order to reduce pain and improve functional task performance    Baseline 10/9- WFL for flexion/ABD, but remain stiff with functional IR/ER   Time  3   Period Weeks   Status Partially Met     PT SHORT TERM GOAL #3   Title Patient to demonstrate correct posture at least 70% of the time in order to assist in reducing pain and improving functional task performance    Baseline 10/9- improving    Time 3   Period Weeks   Status On-going     PT SHORT TERM GOAL #4   Title Patient to be independent in correctly and consistently performing appropriate HEP, to be updated PRN    Baseline 10/9- adherent    Time 3   Period Weeks   Status Achieved           PT Long Term Goals - 03/11/16 1456      PT LONG TERM GOAL #1   Title Patient to demonstrate strength at least 4+/5 in all tested muscle groups in order to assist in pain reduction and return to PLOF    Baseline 10/9- remains weak, possibly due to pain inhibition    Time 6   Period Weeks   Status On-going     PT LONG TERM  GOAL #2   Title Patient to demonstrate correct mechanics for functional floor to overhead lift in order to reduce and prevent exacerbation of cervical pain for safe return to work    Time 6   Period Weeks   Status On-going     PT LONG TERM GOAL #3   Title Patient to experience pain no more than 1/10 in order to improve general QOL and ability to sleep well at night    Baseline 10/9- pain can still get to 3-4/10; she continues to wake up due to pain at night    Time 6   Period Weeks   Status On-going     PT LONG TERM GOAL #4   Title Patient to report she has been able to return to full duty at work with no exacerbation in cervical pain past 1/10 in order to facilitate return to PLOF    Time 6   Period Weeks   Status On-going               Plan - 03/13/16 1801    Clinical Impression Statement Began session with moist heat (not included in billing) and proceeded immediately to aggressive manual to regional musculature bilaterally; introduced multiple combination activities with cervical retraction, including B UE flexion with and without light load, as well as rotation and lateral flexion with verbal cues needed initially but this decreased as session progressed. Encouraged patient to practice cervical retraction/rotation and lateral flexion as part of HEP- will revisit/check form next session.    Rehab Potential Good   Clinical Impairments Affecting Rehab Potential very small C7 avulsion fracture    PT Frequency 2x / week   PT Duration 3 weeks   PT Treatment/Interventions ADLs/Self Care Home Management;Biofeedback;Cryotherapy;Moist Heat;Functional mobility training;Therapeutic activities;Therapeutic exercise;Balance training;Neuromuscular re-education;Patient/family education;Manual techniques;Passive range of motion;Energy conservation;Taping   PT Next Visit Plan manual first, then more aggressive postural training/ ther ex; functional lifting/cervical motions with cervical  retraction. Prone exercises. AVOID JOINT MOBS/POSITIONS WITH EXTENDED CERVICAL EXTENSION DUE TO POSITIVE VA SIGNS.    PT Home Exercise Plan 9/25: scapular retractions, backwards shoulder rolls, 3D cervical/thoracic excursions    Consulted and Agree with Plan of Care Patient      Patient will benefit from skilled therapeutic intervention in order to improve the following deficits and impairments:  Increased fascial restricitons, Improper body mechanics,  Pain, Decreased mobility, Increased muscle spasms, Postural dysfunction, Decreased activity tolerance, Decreased range of motion, Decreased strength, Hypomobility, Impaired UE functional use, Impaired flexibility  Visit Diagnosis: Cervicalgia  Abnormal posture  Muscle weakness (generalized)  Cramp and spasm     Problem List There are no active problems to display for this patient.   Deniece Ree PT, DPT [phone]  [place] [address] [place], [place], [address] Phone: [phone]   Fax:  [phone]  Name: SHAQUIA BERKLEY MRN: [id number] Date of Birth: 03/06/1956

## 2016-03-18 ENCOUNTER — Ambulatory Visit (HOSPITAL_COMMUNITY): Payer: BLUE CROSS/BLUE SHIELD | Admitting: Physical Therapy

## 2016-03-18 DIAGNOSIS — R293 Abnormal posture: Secondary | ICD-10-CM

## 2016-03-18 DIAGNOSIS — M542 Cervicalgia: Secondary | ICD-10-CM | POA: Diagnosis not present

## 2016-03-18 DIAGNOSIS — M6281 Muscle weakness (generalized): Secondary | ICD-10-CM

## 2016-03-18 DIAGNOSIS — R252 Cramp and spasm: Secondary | ICD-10-CM

## 2016-03-18 NOTE — Therapy (Signed)
[place], [place], [address] Phone: [phone]   Fax:  [phone]  Physical Therapy Treatment  Patient Details  Name: Lisa Collins MRN: [id number] Date of Birth: 1956-03-18 Referring Provider: Kevan Ny Ditty   Encounter Date: 03/18/2016      PT End of Session - 03/18/16 1517    Visit Number 9   Number of Visits 13   Date for PT Re-Evaluation 04/01/16   Authorization Type [employer]BS Other (20 visit limit)   Authorization Time Period 02/21/16 to 04/03/16   Authorization - Visit Number 9   Authorization - Number of Visits 20   PT Start Time 1430   PT Stop Time 1505  unbilled moist heat, time deducted accordingly    PT Time Calculation (min) 35 min   Activity Tolerance Patient tolerated treatment well   Behavior During Therapy [facility] for tasks assessed/performed      No past medical history on file.  No past surgical history on file.  There were no vitals filed for this visit.      Subjective Assessment - 03/18/16 1431    Subjective Patient arrives today stating that she is feeling a little under the weather, but does not have a fever; her neck remains sore and she is getting frustrated with the pain. She does report that she was sore after last session and feels that she got a good workout. Nothing else major going on.    Pertinent History no significant PMH or PSH    Currently in Pain? Yes   Pain Score 2    Pain Location Neck   Pain Orientation Right;Left;Posterior   Pain Descriptors / Indicators Sharp;Shooting   Pain Type Chronic pain   Pain Radiating Towards little bit into shoulders    Pain Onset More than a month ago   Pain Frequency Constant   Aggravating Factors  not sure    Pain Relieving Factors heat packs, medication    Effect of Pain on Daily Activities still not back at work                          [facility] Adult PT Treatment/Exercise - 03/18/16 0001      Neck  Exercises: Standing   Other Standing Exercises all in standing with cervical retraction: B UE flexion 1x15; rotations 1x15; lateral flexion 1x15; B UE flexion with 1000g ball      Neck Exercises: Seated   Neck Retraction 15 reps   Neck Retraction Limitations 3 second hold      Neck Exercises: Prone   Other Prone Exercise prone Is and Ws 1x10      Manual Therapy   Manual Therapy Soft tissue mobilization   Manual therapy comments performed separately from all other skilled services    Soft tissue mobilization Bil upper traps, supraspinatus, levator, occipitals, rhomboids                 PT Education - 03/18/16 1517    Education provided No          PT Short Term Goals - 03/11/16 1454      PT SHORT TERM GOAL #1   Title Patient will demonstrate cervical and thoracic ROM as being [facility] on all measured planes in order to reduce pain and improve posture    Baseline 10/9- improving    Time 3   Period Weeks   Status On-going     PT  SHORT TERM GOAL #2   Title Patient to demonstrate bilateral shoulder ROM as being [facility] on all measured planes in order to reduce pain and improve functional task performance    Baseline 10/9- WFL for flexion/ABD, but remain stiff with functional IR/ER   Time 3   Period Weeks   Status Partially Met     PT SHORT TERM GOAL #3   Title Patient to demonstrate correct posture at least 70% of the time in order to assist in reducing pain and improving functional task performance    Baseline 10/9- improving    Time 3   Period Weeks   Status On-going     PT SHORT TERM GOAL #4   Title Patient to be independent in correctly and consistently performing appropriate HEP, to be updated PRN    Baseline 10/9- adherent    Time 3   Period Weeks   Status Achieved           PT Long Term Goals - 03/11/16 1456      PT LONG TERM GOAL #1   Title Patient to demonstrate strength at least 4+/5 in all tested muscle groups in order to assist in pain reduction and  return to PLOF    Baseline 10/9- remains weak, possibly due to pain inhibition    Time 6   Period Weeks   Status On-going     PT LONG TERM GOAL #2   Title Patient to demonstrate correct mechanics for functional floor to overhead lift in order to reduce and prevent exacerbation of cervical pain for safe return to work    Time 6   Period Weeks   Status On-going     PT LONG TERM GOAL #3   Title Patient to experience pain no more than 1/10 in order to improve general QOL and ability to sleep well at night    Baseline 10/9- pain can still get to 3-4/10; she continues to wake up due to pain at night    Time 6   Period Weeks   Status On-going     PT LONG TERM GOAL #4   Title Patient to report she has been able to return to full duty at work with no exacerbation in cervical pain past 1/10 in order to facilitate return to PLOF    Time 6   Period Weeks   Status On-going               Plan - 03/18/16 1518    Clinical Impression Statement Began session with moist heat on bilateral upper traps and cervical extensors (not included in billing), followed immediately by extensive deep manual to bilateral upper traps and cervical/scapular stabilizers/accessory muscles. Continued with functional tasks involving cervical retractions and functional lifting in standing today to assist in progressing to lifting tasks. Upper trap continues to be pathologically dominant with functional tasks.  Advised patient to make appointment with referring MD for early November to assess readiness to return to work.    Rehab Potential Good   Clinical Impairments Affecting Rehab Potential very small C7 avulsion fracture    PT Frequency 2x / week   PT Duration 3 weeks   PT Treatment/Interventions ADLs/Self Care Home Management;Biofeedback;Cryotherapy;Moist Heat;Functional mobility training;Therapeutic activities;Therapeutic exercise;Balance training;Neuromuscular re-education;Patient/family education;Manual  techniques;Passive range of motion;Energy conservation;Taping   PT Next Visit Plan manual first, then more aggressive postural training/ ther ex; functional lifting/cervical motions with cervical retraction. Prone exercises. AVOID JOINT MOBS/POSITIONS WITH EXTENDED CERVICAL EXTENSION DUE TO POSITIVE VA SIGNS.  PT Home Exercise Plan 9/25: scapular retractions, backwards shoulder rolls, 3D cervical/thoracic excursions    Consulted and Agree with Plan of Care Patient      Patient will benefit from skilled therapeutic intervention in order to improve the following deficits and impairments:  Increased fascial restricitons, Improper body mechanics, Pain, Decreased mobility, Increased muscle spasms, Postural dysfunction, Decreased activity tolerance, Decreased range of motion, Decreased strength, Hypomobility, Impaired UE functional use, Impaired flexibility  Visit Diagnosis: Cervicalgia  Abnormal posture  Muscle weakness (generalized)  Cramp and spasm     Problem List There are no active problems to display for this patient.   Deniece Ree PT, DPT [phone]  [place] [address] [place], [place], [address] Phone: [phone]   Fax:  [phone]  Name: Lisa Collins MRN: [id number] Date of Birth: 23-Mar-1956

## 2016-03-20 ENCOUNTER — Ambulatory Visit (HOSPITAL_COMMUNITY): Payer: BLUE CROSS/BLUE SHIELD | Admitting: Physical Therapy

## 2016-03-20 ENCOUNTER — Telehealth (HOSPITAL_COMMUNITY): Payer: Self-pay

## 2016-03-20 NOTE — Telephone Encounter (Signed)
10/18 pt left a message to cx but no reason given

## 2016-03-25 ENCOUNTER — Ambulatory Visit (HOSPITAL_COMMUNITY): Payer: BLUE CROSS/BLUE SHIELD | Admitting: Physical Therapy

## 2016-03-25 ENCOUNTER — Telehealth (HOSPITAL_COMMUNITY): Payer: Self-pay | Admitting: Physical Therapy

## 2016-03-25 DIAGNOSIS — M542 Cervicalgia: Secondary | ICD-10-CM

## 2016-03-25 DIAGNOSIS — R293 Abnormal posture: Secondary | ICD-10-CM

## 2016-03-25 DIAGNOSIS — M6281 Muscle weakness (generalized): Secondary | ICD-10-CM

## 2016-03-25 DIAGNOSIS — R252 Cramp and spasm: Secondary | ICD-10-CM

## 2016-03-25 NOTE — Telephone Encounter (Signed)
today is the 10th visit 25.00 copay Advised pt to [facility] [employer] [facility] and let them advise her if she needs to sign Medical Release for [employer] requesting MD leter stating how long she will be out of work and progress notes eval/treament and Diagnosis.Will wait for patient to bring form back of Medical Release after she talks to HR>  Claim#[id number] Contacted Lequita Halt: [place] @ [phone] Fax: [phone]  NF 03/25/2016

## 2016-03-25 NOTE — Therapy (Signed)
[place], [place], [address] Phone: [phone]   Fax:  [phone]  Physical Therapy Treatment (Re-Assessment)  Patient Details  Name: Lisa Collins MRN: [id number] Date of Birth: 23-Jun-1955 Referring Provider: Kevan Ny Ditty   Encounter Date: 03/25/2016      PT End of Session - 03/25/16 1459    Visit Number 10   Number of Visits 14   Date for PT Re-Evaluation 04/15/16   Authorization Type [employer] Other (20 visit limit)   Authorization Time Period 09/26/81 to 41/9/62; recert done 22/97/98   Authorization - Visit Number 10   Authorization - Number of Visits 20   PT Start Time 9211   PT Stop Time 9417  ended early as patient not feeling good, still seemed to have active fever   PT Time Calculation (min) 23 min   Activity Tolerance Patient tolerated treatment well   Behavior During Therapy [facility] for tasks assessed/performed      No past medical history on file.  No past surgical history on file.  There were no vitals filed for this visit.          [facility] PT Assessment - 03/25/16 0001      Assessment   Medical Diagnosis neck pain    Referring Provider Kevan Ny Ditty    Onset Date/Surgical Date 01/19/16   Next MD Visit Dr. Cyndy Freeze unscheduled      Balance Screen   Has the patient fallen in the past 6 months No   Has the patient had a decrease in activity level because of a fear of falling?  No   Is the patient reluctant to leave their home because of a fear of falling?  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Radiographer, therapeutic, cash register, climbs ladders      AROM   Cervical Flexion 55   Cervical Extension 24   Cervical - Right Side Bend 28   Cervical - Left Side Bend 30   Cervical - Right Rotation 60   Cervical - Left Rotation 60   Thoracic Flexion WFL   Thoracic Extension mod limitation    Thoracic - Right Side Bend mod limitation    Thoracic - Left Side Bend mod limitation    Thoracic - Right Rotation mod limitation      Palpation   Palpation comment severe muscle knotting throughout occipital muscles, B upper taps, scapular stabilizer muscles                             PT Education - 03/25/16 1459    Education provided Yes   Education Details dry needling and possible benefits since quite a bit of her impairment appears to be soft tissue related   Person(s) Educated Patient   Methods Explanation   Comprehension Verbalized understanding          PT Short Term Goals - 03/11/16 1454      PT SHORT TERM GOAL #1   Title Patient will demonstrate cervical and thoracic ROM as being [facility] on all measured planes in order to reduce pain and improve posture    Baseline 10/9- improving    Time 3   Period Weeks   Status On-going     PT SHORT TERM GOAL #2   Title  Patient to demonstrate bilateral shoulder ROM as being [facility] on all measured planes in order to reduce pain and improve functional task performance    Baseline 10/9- WFL for flexion/ABD, but remain stiff with functional IR/ER   Time 3   Period Weeks   Status Partially Met     PT SHORT TERM GOAL #3   Title Patient to demonstrate correct posture at least 70% of the time in order to assist in reducing pain and improving functional task performance    Baseline 10/9- improving    Time 3   Period Weeks   Status On-going     PT SHORT TERM GOAL #4   Title Patient to be independent in correctly and consistently performing appropriate HEP, to be updated PRN    Baseline 10/9- adherent    Time 3   Period Weeks   Status Achieved           PT Long Term Goals - 03/11/16 1456      PT LONG TERM GOAL #1   Title Patient to demonstrate strength at least 4+/5 in all tested muscle groups in order to assist in pain reduction and return to PLOF     Baseline 10/9- remains weak, possibly due to pain inhibition    Time 6   Period Weeks   Status On-going     PT LONG TERM GOAL #2   Title Patient to demonstrate correct mechanics for functional floor to overhead lift in order to reduce and prevent exacerbation of cervical pain for safe return to work    Time 6   Period Weeks   Status On-going     PT LONG TERM GOAL #3   Title Patient to experience pain no more than 1/10 in order to improve general QOL and ability to sleep well at night    Baseline 10/9- pain can still get to 3-4/10; she continues to wake up due to pain at night    Time 6   Period Weeks   Status On-going     PT LONG TERM GOAL #4   Title Patient to report she has been able to return to full duty at work with no exacerbation in cervical pain past 1/10 in order to facilitate return to PLOF    Time 6   Period Weeks   Status On-going               Plan - 03/25/16 1503    Clinical Harris performed today. Patient recently was unable to take her muscle relaxers as she has a stomach virus and was constantly throwing up; she is really not feeling good at all without the relaxers in her system, with increased pain and discomfort noted. Mini-examination performed, noting ongoing severe muscle and soft tissue restrictions, poor posture, and reduced cervical and thoracic ROM/flexibility. A large part of impairment appears to be related to ongoing soft tissue restrictions, which is validated through her response to not being able to take muscle relaxers secondary to illness. Recommend a brief extension of skilled PT services with the addition of dry needling to further target soft tissue restrictions.    Rehab Potential Good   Clinical Impairments Affecting Rehab Potential very small C7 avulsion fracture    PT Frequency Other (comment)  4 more visits    PT Duration Other (comment)  4 more visits    PT Treatment/Interventions ADLs/Self Care  Home Management;Biofeedback;Cryotherapy;Moist Heat;Functional mobility training;Therapeutic activities;Therapeutic exercise;Balance training;Neuromuscular re-education;Patient/family education;Manual techniques;Passive range of motion;Energy conservation;Taping;Dry  needling   PT Next Visit Plan Needs to be with [place]- introduce dry needling if appropriate    Consulted and Agree with Plan of Care Patient      Patient will benefit from skilled therapeutic intervention in order to improve the following deficits and impairments:  Increased fascial restricitons, Improper body mechanics, Pain, Decreased mobility, Increased muscle spasms, Postural dysfunction, Decreased activity tolerance, Decreased range of motion, Decreased strength, Hypomobility, Impaired UE functional use, Impaired flexibility  Visit Diagnosis: Cervicalgia - Plan: PT plan of care cert/re-cert  Abnormal posture - Plan: PT plan of care cert/re-cert  Muscle weakness (generalized) - Plan: PT plan of care cert/re-cert  Cramp and spasm - Plan: PT plan of care cert/re-cert     Problem List There are no active problems to display for this patient.   Deniece Ree PT, DPT [phone]  [facility] [address] [place], [place], [address] Phone: [phone]   Fax:  [phone]  Name: NAJWA SPILLANE MRN: [id number] Date of Birth: 1955/08/11

## 2016-03-26 NOTE — Addendum Note (Signed)
Addended by: Nedra HaiUNGER, Demaree Liberto E on: 03/26/2016 10:20 AM   Modules accepted: Orders

## 2016-03-27 ENCOUNTER — Ambulatory Visit (HOSPITAL_COMMUNITY): Payer: BLUE CROSS/BLUE SHIELD

## 2016-03-27 DIAGNOSIS — R293 Abnormal posture: Secondary | ICD-10-CM

## 2016-03-27 DIAGNOSIS — M542 Cervicalgia: Secondary | ICD-10-CM

## 2016-03-27 DIAGNOSIS — M6281 Muscle weakness (generalized): Secondary | ICD-10-CM

## 2016-03-27 DIAGNOSIS — R252 Cramp and spasm: Secondary | ICD-10-CM

## 2016-03-27 NOTE — Patient Instructions (Signed)
Trigger Point Dry Needling: What Should I Know?  . What is Trigger Point Dry Needling (DN)? o DN is a physical therapy technique used to treat muscle pain and dysfunction. Specifically, DN helps deactivate muscle trigger points that can cause painful muscle knots, as well as weakness.   o A thin filiform needle is used to penetrate the skin and target the underlying trigger point. The goal is to elicit an immediate response in the tissue that may take anywhere from 1-30 seconds.  o Patients may experience a local twitch response (LTR), followed by relaxation of the surrounding tissue. No medication of any kind is injected during the procedure.   . What Does Trigger Point Dry Needling Feel Like?  o The procedure feels different for each individual patient. Some patients report that they do not actually feel the needle enter the skin and overall the process is not painful. Very mild bleeding may occur. o Many patients feel a deep aching or heaviness in the muscle in which the needle was inserted, which often feels similar to when pressure is applied to the trigger point.  o Patients may also feel local twitch and relaxation of the muscle.    . How Will I feel after the treatment? o Soreness is normal, and the onset of soreness may not occur for a few hours. Typically this soreness does not last longer than two days.  o Mild bruising can occur occasionally, similar to what is experienced after an injection of medication.   o In rare cases, patients may feel lightheaded, tired, or nauseous after the treatment. This is normal. In addition, your symptoms may get worse before they get better, this period will typically not last longer than 24 hours.   . What Can I do After My Treatment? o Increase your hydration by drinking more water for the next 24 hours. o You may place ice or heat on the areas treated that have become sore, however, do not use heat on inflamed or bruised areas. Heat often brings more  relief post needling. o You can continue your regular activities, but vigorous activity is not recommended initially after the treatment for 24 hours. o DN is best combined with other physical therapy such as strengthening, stretching, and other therapies.   

## 2016-03-27 NOTE — Therapy (Signed)
[place], [place], [address] Phone: [phone]   Fax:  [phone]  Physical Therapy Treatment  Patient Details  Name: Lisa Collins MRN: [id number] Date of Birth: [birth year] Referring Provider: Kevan Ny Ditty   Encounter Date: 03/27/2016      PT End of Session - 03/27/16 1541    Visit Number 11   Number of Visits 20   Date for PT Re-Evaluation 04/15/16   Authorization Type [employer] Other (20 visit limit)   Authorization Time Period 3/84/66 to 59/9/35; recert done 70/17/79   Authorization - Visit Number 11   Authorization - Number of Visits 20   PT Start Time 3903   PT Stop Time 1531   PT Time Calculation (min) 53 min   Activity Tolerance Patient tolerated treatment well   Behavior During Therapy [facility] for tasks assessed/performed      No past medical history on file.  No past surgical history on file.  There were no vitals filed for this visit.      Subjective Assessment - 03/27/16 1440    Subjective Pt reports she is doing about average today. Her pain was quite bad b/c she had to DC her medications due to a stomach bug wherein her pain management stopped, btu she is back on now. She continues to attempt her HEP at home, but is limited with pain.    Pertinent History no significant PMH or PSH    Currently in Pain? Yes   Pain Score 5    Pain Location --  bilat upper trap area and bilat neck   Pain Orientation Left;Right;Lateral   Pain Type Chronic pain            [facility] PT Assessment - 03/27/16 0001      AROM   Cervical Flexion 55  no change (normal ROM)    Cervical Extension 45  was 24 degrees on 10/23   Cervical - Right Side Bend 30   Cervical - Left Side Bend 36  was 30 degrees on 10/23   Cervical - Right Rotation 65  was 60 degrees on 10/23   Cervical - Left Rotation 73  was 60 degrees on 10/23                     [facility] Adult PT Treatment/Exercise - 03/27/16  0001      Neck Exercises: Seated   Other Seated Exercise seated capital flexion stretch      Moist Heat Therapy   Number Minutes Moist Heat 20 Minutes   Moist Heat Location Shoulder  bilat, concurrent with contrlateral MFR     Manual Therapy   Manual Therapy Myofascial release;Soft tissue mobilization   Soft tissue mobilization Bil upper traps, bilat suboccipitals, bilat cervical multifidus          Trigger Point Dry Needling - 03/27/16 1535    Consent Given? Yes   Education Handout Provided Yes   Muscles Treated Upper Body Upper trapezius;Suboccipitals muscle group  cervical multifidus   Upper Trapezius Response Twitch reponse elicited;Palpable increased muscle length  Improved R lateral flexion, imrpoved bilat rotation   SubOccipitals Response Palpable increased muscle length  increased pain threshold; pain worse on left side.               PT Education - 03/27/16 1540    Education provided Yes   Education Details Dry needling education, precautions; asked to take qadvantage of improved mobilty and  perform high volume/lowload AROM/HEP over th enext several days. Asked to walk 2-3x prior to next appointment.    Person(s) Educated Patient   Methods Explanation   Comprehension Verbalized understanding          PT Short Term Goals - 03/11/16 1454      PT SHORT TERM GOAL #1   Title Patient will demonstrate cervical and thoracic ROM as being WFL on all measured planes in order to reduce pain and improve posture    Baseline 10/9- improving    Time 3   Period Weeks   Status On-going     PT SHORT TERM GOAL #2   Title Patient to demonstrate bilateral shoulder ROM as being [facility] on all measured planes in order to reduce pain and improve functional task performance    Baseline 10/9- WFL for flexion/ABD, but remain stiff with functional IR/ER   Time 3   Period Weeks   Status Partially Met     PT SHORT TERM GOAL #3   Title Patient to demonstrate correct posture at  least 70% of the time in order to assist in reducing pain and improving functional task performance    Baseline 10/9- improving    Time 3   Period Weeks   Status On-going     PT SHORT TERM GOAL #4   Title Patient to be independent in correctly and consistently performing appropriate HEP, to be updated PRN    Baseline 10/9- adherent    Time 3   Period Weeks   Status Achieved           PT Long Term Goals - 03/11/16 1456      PT LONG TERM GOAL #1   Title Patient to demonstrate strength at least 4+/5 in all tested muscle groups in order to assist in pain reduction and return to PLOF    Baseline 10/9- remains weak, possibly due to pain inhibition    Time 6   Period Weeks   Status On-going     PT LONG TERM GOAL #2   Title Patient to demonstrate correct mechanics for functional floor to overhead lift in order to reduce and prevent exacerbation of cervical pain for safe return to work    Time 6   Period Weeks   Status On-going     PT LONG TERM GOAL #3   Title Patient to experience pain no more than 1/10 in order to improve general QOL and ability to sleep well at night    Baseline 10/9- pain can still get to 3-4/10; she continues to wake up due to pain at night    Time 6   Period Weeks   Status On-going     PT LONG TERM GOAL #4   Title Patient to report she has been able to return to full duty at work with no exacerbation in cervical pain past 1/10 in order to facilitate return to PLOF    Time 6   Period Weeks   Status On-going               Plan - 03/27/16 1542    Clinical Impression Statement Session focused on trigger point dry needling today, along with soft-tissue mobilization, an myofascial release to areas that have been weel establishe dto be contributory to patients C/C of pain and movement dysfucntion. Noted imrpovent in pain pressure threshold, tissue mobility, cervical ROM, and reduction of myofascial trigger points, however pain is notably sore throughout  the upper traps after session,  particularly on the Left side. HEP stretches are reviewed, and pt asked to being a daily walking program. PT was unable to address tightness in scalenes this session but will be addresed next visit.    Rehab Potential Good   Clinical Impairments Affecting Rehab Potential very small C7 avulsion fracture    PT Frequency Other (comment)   PT Duration Other (comment)   PT Treatment/Interventions ADLs/Self Care Home Management;Biofeedback;Cryotherapy;Moist Heat;Functional mobility training;Therapeutic activities;Therapeutic exercise;Balance training;Neuromuscular re-education;Patient/family education;Manual techniques;Passive range of motion;Energy conservation;Taping;Dry needling   PT Next Visit Plan Repeat TPDN as needed, try TPDN to scalenes, cervical spine mobilization, continue with elements from prior program as able. STM, MFR. Update HEP.    PT Home Exercise Plan 9/25: scapular retractions, backwards shoulder rolls, 3D cervical/thoracic excursions; Update going forward.    Consulted and Agree with Plan of Care Patient      Patient will benefit from skilled therapeutic intervention in order to improve the following deficits and impairments:  Increased fascial restricitons, Improper body mechanics, Pain, Decreased mobility, Increased muscle spasms, Postural dysfunction, Decreased activity tolerance, Decreased range of motion, Decreased strength, Hypomobility, Impaired UE functional use, Impaired flexibility  Visit Diagnosis: Cervicalgia  Abnormal posture  Muscle weakness (generalized)  Cramp and spasm     Problem List There are no active problems to display for this patient.   3:48 PM, 03/27/16 Etta Grandchild, PT, DPT Physical Therapist at [place] [phone] (office)     [place], [place], [address] Phone: [phone]   Fax:   [phone]  Name: Lisa Collins MRN: [id number] Date of Birth: 12/05/1955

## 2016-04-01 ENCOUNTER — Ambulatory Visit (HOSPITAL_COMMUNITY): Payer: BLUE CROSS/BLUE SHIELD

## 2016-04-01 DIAGNOSIS — M542 Cervicalgia: Secondary | ICD-10-CM | POA: Diagnosis not present

## 2016-04-01 DIAGNOSIS — R293 Abnormal posture: Secondary | ICD-10-CM

## 2016-04-01 DIAGNOSIS — M6281 Muscle weakness (generalized): Secondary | ICD-10-CM

## 2016-04-01 DIAGNOSIS — R252 Cramp and spasm: Secondary | ICD-10-CM

## 2016-04-01 NOTE — Therapy (Signed)
[place], [place], [address] Phone: [phone]   Fax:  [phone]  Physical Therapy Treatment  Patient Details  Name: Lisa Collins MRN: [id number] Date of Birth: 03-27-1956 Referring Provider: Kevan Ny Ditty   Encounter Date: 04/01/2016      PT End of Session - 04/01/16 1533    Visit Number 12   Number of Visits 20   Date for PT Re-Evaluation 04/15/16   Authorization Type [employer] Other (20 visit limit)   Authorization Time Period 1/68/37 to 29/0/21; recert done 11/55/20   Authorization - Visit Number 12   Authorization - Number of Visits 20   PT Start Time 8022   PT Stop Time 1520   PT Time Calculation (min) 41 min   Activity Tolerance Patient tolerated treatment well;No increased pain;Patient limited by pain   Behavior During Therapy [facility] for tasks assessed/performed      No past medical history on file.  No past surgical history on file.  There were no vitals filed for this visit.      Subjective Assessment - 04/01/16 1443    Subjective Pt reportign she is doing well today. She is excited abotu progress seh has made since one session of needling and reports she has maintained the ROM improvements. She has been working on [employer] and got out to do 15 minutes walking.    Pertinent History no significant PMH or PSH    Currently in Pain? Yes   Pain Score 3    Pain Location --  Left suboccipital area   Pain Orientation Left                         [facility] Adult PT Treatment/Exercise - 04/01/16 0001      Neck Exercises: Supine   Neck Retraction 15 reps;3 secs  into 2 pillows   Cervical Rotation 15 reps;Both  3sH with manual overpressure (pain toward left only)    Cervical Rotation Limitations AAROM with manual overpressure  Performed on hotpack to improve tolerance   Lateral Flexion Both;15 reps  3sH, performed on heat   Lateral Flexion Limitations AAROM with manual  overpressure     Moist Heat Therapy   Number Minutes Moist Heat 20 Minutes   Moist Heat Location Cervical  Posterior cervical spine, during other exercises.      Manual Therapy   Manual Therapy Joint mobilization;Passive ROM   Joint Mobilization Gr III/IV: 1x30sec (AO joints bilat)  Left first rib mobilization, inferior glide 2x30sec   Passive ROM AO joints/suboccipitals: 2x30sec bilat  Lateral SB Rt, ART to splenii 10 minutes          Trigger Point Dry Needling - 04/01/16 1519    Consent Given? Yes   Education Handout Provided No  issued at last visit (10/25)   Muscles Treated Upper Body Upper trapezius   Upper Trapezius Response Twitch reponse elicited;Palpable increased muscle length  (Splenius Cervisus/capitus) incr ROM R lat flexion 20-33 deg                PT Short Term Goals - 03/11/16 1454      PT SHORT TERM GOAL #1   Title Patient will demonstrate cervical and thoracic ROM as being [facility] on all measured planes in order to reduce pain and improve posture    Baseline 10/9- improving    Time 3   Period Weeks   Status On-going  PT SHORT TERM GOAL #2   Title Patient to demonstrate bilateral shoulder ROM as being [facility] on all measured planes in order to reduce pain and improve functional task performance    Baseline 10/9- WFL for flexion/ABD, but remain stiff with functional IR/ER   Time 3   Period Weeks   Status Partially Met     PT SHORT TERM GOAL #3   Title Patient to demonstrate correct posture at least 70% of the time in order to assist in reducing pain and improving functional task performance    Baseline 10/9- improving    Time 3   Period Weeks   Status On-going     PT SHORT TERM GOAL #4   Title Patient to be independent in correctly and consistently performing appropriate HEP, to be updated PRN    Baseline 10/9- adherent    Time 3   Period Weeks   Status Achieved           PT Long Term Goals - 03/11/16 1456      PT LONG TERM GOAL #1    Title Patient to demonstrate strength at least 4+/5 in all tested muscle groups in order to assist in pain reduction and return to PLOF    Baseline 10/9- remains weak, possibly due to pain inhibition    Time 6   Period Weeks   Status On-going     PT LONG TERM GOAL #2   Title Patient to demonstrate correct mechanics for functional floor to overhead lift in order to reduce and prevent exacerbation of cervical pain for safe return to work    Time 6   Period Weeks   Status On-going     PT LONG TERM GOAL #3   Title Patient to experience pain no more than 1/10 in order to improve general QOL and ability to sleep well at night    Baseline 10/9- pain can still get to 3-4/10; she continues to wake up due to pain at night    Time 6   Period Weeks   Status On-going     PT LONG TERM GOAL #4   Title Patient to report she has been able to return to full duty at work with no exacerbation in cervical pain past 1/10 in order to facilitate return to PLOF    Time 6   Period Weeks   Status On-going               Plan - 04/01/16 1535    Clinical Impression Statement Session with continued focus on resolvign myofascial restrictons adn muscle spasm, and manual therapy to mobilize cervical joints. Pt tolerating session well, with sustained ROM from last visit, still most limited in R lateral flexion, but improving from 20 to 35 degrees after TPDN and MFR to the left lateral cervical extensors. Pt toleratign needling well, inittially somewaht anxious, but agreebale overall and acknowledging the great benefit from last session.  Pt making excellent progress toward goals with some dicsussion about return to work timeframes. HEP is updated to move AAROM/stretches to supine with manual overpressure.    Rehab Potential Good   Clinical Impairments Affecting Rehab Potential very small C7 avulsion fracture    PT Frequency Other (comment)   PT Duration Other (comment)   PT Treatment/Interventions ADLs/Self  Care Home Management;Biofeedback;Cryotherapy;Moist Heat;Functional mobility training;Therapeutic activities;Therapeutic exercise;Balance training;Neuromuscular re-education;Patient/family education;Manual techniques;Passive range of motion;Energy conservation;Taping;Dry needling   PT Next Visit Plan Repeat TPDN as needed, try TPDN to scalenes, cervical spine mobilization,  continue with elements from prior program as able. STM, MFR. Update HEP.    PT Home Exercise Plan 9/25: scapular retractions, backwards shoulder rolls, 3D cervical/thoracic excursions; 10/30: moved AAROM rotation/SB to supine with manual overpressure beyond point of pain. Update going forward.    Consulted and Agree with Plan of Care Patient      Patient will benefit from skilled therapeutic intervention in order to improve the following deficits and impairments:  Increased fascial restricitons, Improper body mechanics, Pain, Decreased mobility, Increased muscle spasms, Postural dysfunction, Decreased activity tolerance, Decreased range of motion, Decreased strength, Hypomobility, Impaired UE functional use, Impaired flexibility  Visit Diagnosis: Cervicalgia  Abnormal posture  Muscle weakness (generalized)  Cramp and spasm     Problem List There are no active problems to display for this patient.  3:41 PM, 04/01/16 Etta Grandchild, PT, DPT Physical Therapist at [place] [phone] (office)     [place] [address] [place], [place], [address] Phone: [phone]   Fax:  [phone]  Name: Lisa Collins MRN: [id number] Date of Birth: December 15, 1955

## 2016-04-03 ENCOUNTER — Ambulatory Visit (HOSPITAL_COMMUNITY): Payer: BLUE CROSS/BLUE SHIELD | Attending: Neurological Surgery

## 2016-04-03 DIAGNOSIS — R252 Cramp and spasm: Secondary | ICD-10-CM | POA: Diagnosis present

## 2016-04-03 DIAGNOSIS — M542 Cervicalgia: Secondary | ICD-10-CM | POA: Diagnosis present

## 2016-04-03 DIAGNOSIS — R293 Abnormal posture: Secondary | ICD-10-CM | POA: Diagnosis present

## 2016-04-03 DIAGNOSIS — M6281 Muscle weakness (generalized): Secondary | ICD-10-CM | POA: Diagnosis present

## 2016-04-03 NOTE — Therapy (Signed)
[place], [place], [address] Phone: [phone]   Fax:  [phone]  Physical Therapy Treatment  Patient Details  Name: Lisa Collins MRN: [id number] Date of Birth: [birth year] Referring Provider: Kevan Ny Ditty   Encounter Date: 04/03/2016      PT End of Session - 04/03/16 1500    Visit Number 13   Number of Visits 20   Date for PT Re-Evaluation 04/15/16   Authorization Type BCBS Other (20 visit limit)   Authorization Time Period 7/82/42 to 35/3/61; recert done 44/31/54   Authorization - Visit Number 13   Authorization - Number of Visits 20   PT Start Time 0086   PT Stop Time 7619   PT Time Calculation (min) 38 min   Activity Tolerance Patient tolerated treatment well;No increased pain;Patient limited by pain   Behavior During Therapy [facility] for tasks assessed/performed      No past medical history on file.  No past surgical history on file.  There were no vitals filed for this visit.      Subjective Assessment - 04/03/16 1308    Subjective Pt is doing well today. She said she has had some stinging pain in the R UT since yesterday. She is very pleased with result sshe has had over the last few sessions.    Pertinent History no significant PMH or PSH    Currently in Pain? Yes   Pain Score 2    Pain Location --  R Upper trapezius   Pain Orientation Right   Pain Descriptors / Indicators --  like a Bee sting   Pain Type Acute pain            [facility] PT Assessment - 04/03/16 0001      AROM   Cervical - Right Rotation 70  supine   Cervical - Left Rotation 68  supine                     [facility] Adult PT Treatment/Exercise - 04/03/16 0001      Neck Exercises: Supine   Neck Retraction 15 reps;5 secs  into 2 pillows   Cervical Rotation Both  3x30sec with manual over presssure.      Moist Heat Therapy   Number Minutes Moist Heat 20 Minutes   Moist Heat Location Cervical   utilized during exercises     Manual Therapy   Manual Therapy Joint mobilization;Passive ROM   Joint Mobilization Gr III/IV: C1/C2 lateral glide   Myofascial Release Upper cervical extensors x 5 minutes   Passive ROM Capital Flexion stretch bilat 2x30sec  capital rotation stretch: Stopped due to RUE numbness, tingl          Trigger Point Dry Needling - 04/03/16 1348    Consent Given? Yes   Education Handout Provided No  Provided on 10/25   SubOccipitals Response Twitch response elicited;Palpable increased muscle length                PT Short Term Goals - 04/03/16 1509      PT SHORT TERM GOAL #1   Title Patient will demonstrate cervical and thoracic ROM as being WFL on all measured planes in order to reduce pain and improve posture    Status Achieved     PT SHORT TERM GOAL #2   Title Patient to demonstrate bilateral shoulder ROM as being [facility] on all measured planes in order to reduce pain and improve functional task  performance    Baseline 10/9- WFL for flexion/ABD, but remain stiff with functional IR/ER   Time 3   Period Weeks   Status Partially Met     PT SHORT TERM GOAL #3   Title Patient to demonstrate correct posture at least 70% of the time in order to assist in reducing pain and improving functional task performance    Baseline 10/9- improving    Period Weeks   Status Achieved     PT SHORT TERM GOAL #4   Title Patient to be independent in correctly and consistently performing appropriate HEP, to be updated PRN    Baseline 10/9- adherent    Time 3   Period Weeks   Status Achieved           PT Long Term Goals - 03/11/16 1456      PT LONG TERM GOAL #1   Title Patient to demonstrate strength at least 4+/5 in all tested muscle groups in order to assist in pain reduction and return to PLOF    Baseline 10/9- remains weak, possibly due to pain inhibition    Time 6   Period Weeks   Status On-going     PT LONG TERM GOAL #2   Title Patient to  demonstrate correct mechanics for functional floor to overhead lift in order to reduce and prevent exacerbation of cervical pain for safe return to work    Time 6   Period Weeks   Status On-going     PT LONG TERM GOAL #3   Title Patient to experience pain no more than 1/10 in order to improve general QOL and ability to sleep well at night    Baseline 10/9- pain can still get to 3-4/10; she continues to wake up due to pain at night    Time 6   Period Weeks   Status On-going     PT LONG TERM GOAL #4   Title Patient to report she has been able to return to full duty at work with no exacerbation in cervical pain past 1/10 in order to facilitate return to PLOF    Time 6   Period Weeks   Status On-going               Plan - 04/03/16 1505    Clinical Impression Statement Pt continues to make excellent progress toward goals and strong response to dry needling and manual therapy in resolution of muscle spasms, ROM limitations, and pain. Pt responding quickly to R suboccipital needling with instance imrpovement in muslce spasm and length. Cervical lateral glides inititally quite painful, but loosening up a bit also. Pt making good progress with updates to HEP at home. One instance of R cervical radicular signs with R cervical rotation + flexion, hence this was ended early and not repeated. Pt reports that she feels ready to be DC after next session and return to work.    Rehab Potential Good   Clinical Impairments Affecting Rehab Potential very small C7 avulsion fracture    PT Frequency Other (comment)   PT Duration Other (comment)   PT Treatment/Interventions ADLs/Self Care Home Management;Biofeedback;Cryotherapy;Moist Heat;Functional mobility training;Therapeutic activities;Therapeutic exercise;Balance training;Neuromuscular re-education;Patient/family education;Manual techniques;Passive range of motion;Energy conservation;Taping;Dry needling   PT Next Visit Plan Reassesment and HEP update  for DC; Cervical spine mobilization PRN, continue with elements from prior program as able. STM, MFR. Update HEP.    PT Home Exercise Plan 9/25: scapular retractions, backwards shoulder rolls, 3D cervical/thoracic excursions; 10/30: moved AAROM  rotation/SB to supine with manual overpressure beyond point of pain. Update going forward.    Consulted and Agree with Plan of Care Patient      Patient will benefit from skilled therapeutic intervention in order to improve the following deficits and impairments:  Increased fascial restricitons, Improper body mechanics, Pain, Decreased mobility, Increased muscle spasms, Postural dysfunction, Decreased activity tolerance, Decreased range of motion, Decreased strength, Hypomobility, Impaired UE functional use, Impaired flexibility  Visit Diagnosis: Cervicalgia  Abnormal posture  Muscle weakness (generalized)  Cramp and spasm     Problem List There are no active problems to display for this patient.   3:11 PM, 04/03/16 Etta Grandchild, PT, DPT Physical Therapist at [place] [phone] (office)     [place] [address] [place], [place], [address] Phone: [phone]   Fax:  [phone]  Name: TERRENCE PIZANA MRN: [id number] Date of Birth: 10/05/1955

## 2016-04-04 ENCOUNTER — Telehealth (HOSPITAL_COMMUNITY): Payer: Self-pay | Admitting: Physical Therapy

## 2016-04-04 NOTE — Telephone Encounter (Signed)
Pt called to confrim no changes to her apptments. NF 04/04/16

## 2016-04-08 ENCOUNTER — Telehealth (HOSPITAL_COMMUNITY): Payer: Self-pay

## 2016-04-08 ENCOUNTER — Ambulatory Visit (HOSPITAL_COMMUNITY): Payer: BLUE CROSS/BLUE SHIELD

## 2016-04-08 DIAGNOSIS — R252 Cramp and spasm: Secondary | ICD-10-CM

## 2016-04-08 DIAGNOSIS — M6281 Muscle weakness (generalized): Secondary | ICD-10-CM

## 2016-04-08 DIAGNOSIS — M542 Cervicalgia: Secondary | ICD-10-CM

## 2016-04-08 DIAGNOSIS — R293 Abnormal posture: Secondary | ICD-10-CM

## 2016-04-08 NOTE — Therapy (Addendum)
PHYSICAL THERAPY DISCHARGE SUMMARY  Visits from Start of Care: 14  Current functional level related to goals / functional outcomes: *see below    Remaining deficits: *see below    Education / Equipment: *see below   Plan: Patient agrees to discharge.  Patient goals were met. Patient is being discharged due to meeting the stated rehab goals.  ?????         3:27 PM, 04/08/16 Etta Grandchild, PT, DPT Physical Therapist at [place] [phone] (office)           [facility] [address] [place], [place], [address] Phone: [phone]   Fax:  [phone]  Physical Therapy Treatment  Patient Details  Name: BASIL BLAKESLEY MRN: [id number] Date of Birth: [birth year] Referring Provider: Suezanne Jacquet Ditty   Encounter Date: 04/08/2016      PT End of Session - 04/08/16 1504    Visit Number 14   Number of Visits 20   Date for PT Re-Evaluation 04/15/16   Authorization Type [employer] Other (20 visit limit)   Authorization Time Period 2/95/18 to 84/1/66; recert done 12/01/14   Authorization - Visit Number 14   Authorization - Number of Visits 20   PT Start Time 0109   PT Stop Time 3235   PT Time Calculation (min) 45 min   Activity Tolerance Patient tolerated treatment well;No increased pain;Patient limited by fatigue   Behavior During Therapy [facility] for tasks assessed/performed      No past medical history on file.  No past surgical history on file.  There were no vitals filed for this visit.      Subjective Assessment - 04/08/16 1427    Subjective Pt reports she is doing well today. Her updated HEP is going well, performed without issue. She has had soem intermittent numbness along the Lt lateral thigh for a couple days now.    Pertinent History no significant PMH or PSH    How long can you stand comfortably? Pt is not limited by her neck pain in terms of standing tolerance.    How long can you walk  comfortably? unlimited since 03/11/16   Diagnostic tests see imaging done on 01/21/16   Patient Stated Goals wants to get back to work and PLOF    Currently in Pain? Yes   Pain Score 2    Pain Location Neck  left cervical extensors near the attachment at the occiput.    Pain Orientation Left   Pain Type Chronic pain   Pain Frequency Constant   Aggravating Factors  lifting withboth arms    Pain Relieving Factors pain meds, heat packs, medications             [facility] PT Assessment - 04/08/16 0001      Assessment   Medical Diagnosis Neck Pain    Referring Provider Ben Ditty    Onset Date/Surgical Date 01/09/16     Balance Screen   Has the patient fallen in the past 6 months No   Has the patient had a decrease in activity level because of a fear of falling?  No   Is the patient reluctant to leave their home because of a fear of falling?  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[employer], cash register, climbs ladders      AROM  Cervical Flexion 46  (17 degrees at eval)    Cervical Extension 41  (40 degrees at evaluation)   Cervical - Right Side Bend 34  (30 at evaluation)    Cervical - Left Side Bend 31  (30 at evaluation)    Cervical - Right Rotation 64  (50 degrees at eval)   Cervical - Left Rotation 64  (40 degrees at eval)   Thoracic - Right Rotation 35   Thoracic - Left Rotation 40     Strength   Right Shoulder Flexion 5/5  4/5 on 10/9   Right Shoulder ABduction 5/5  4-/5 on 10/9   Right Shoulder Internal Rotation 5/5  4/5 on 10/9   Right Shoulder External Rotation 5/5  4+/5 on 10/9   Left Shoulder Flexion 5/5  4/5 on 10/9   Left Shoulder ABduction 5/5  3+/5 on 10/9   Left Shoulder Internal Rotation 5/5  4+/5 on 10/9   Left Shoulder External Rotation 4+/5  4+/5 on 10/9   Left Shoulder Horizontal  ABduction 5/5   Left Shoulder Horizontal ADduction 5/5   Cervical Flexion 5/5  3+/5 on 10/9   Cervical Extension 5/5  3+/5 on 10/9   Cervical - Right Side Bend 5/5  3-/5 on 10/9   Cervical - Left Side Bend 5/5  3-/5 on 10/9   Cervical - Right Rotation 5/5   Cervical - Left Rotation 5/5                     [facility] Adult PT Treatment/Exercise - 04/08/16 0001      Neck Exercises: Standing   Other Standing Exercises STS + overhead press:   1x10, blue 5lb ball, VC for cervical extension.    Other Standing Exercises Half kneeling lunge with 5lb ball at sternum  10x both sides     Neck Exercises: Prone   Other Prone Exercise Quadruped cervical retraction x20                 PT Education - 04/08/16 1503    Education provided Yes   Education Details reviewed safe lifting postures at work for work duties.    Person(s) Educated Patient   Methods Explanation   Comprehension Verbalized understanding          PT Short Term Goals - 04/08/16 1452      PT SHORT TERM GOAL #1   Title Patient will demonstrate cervical and thoracic ROM as being WFL on all measured planes in order to reduce pain and improve posture    Baseline 10/9- improving    Time 3   Period Weeks   Status Achieved     PT SHORT TERM GOAL #2   Title Patient to demonstrate bilateral shoulder ROM as being WFL on all measured planes in order to reduce pain and improve functional task performance    Baseline Bilat overhead flexion/abduciton WNL   Time 3   Period Weeks   Status Achieved     PT SHORT TERM GOAL #3   Title Patient to demonstrate correct posture at least 70% of the time in order to assist in reducing pain and improving functional task performance    Baseline 10/9- improving    Time 3   Period Weeks   Status Achieved     PT SHORT TERM GOAL #4   Title Patient to be independent in correctly and consistently performing appropriate HEP, to be updated PRN    Baseline 10/9- adherent  Time 3   Period Weeks   Status Achieved           PT Long Term Goals - 04/08/16 1454      PT LONG TERM GOAL #1   Title Patient to demonstrate strength at least 4+/5 in all tested muscle groups in order to assist in pain reduction and return to PLOF    Baseline 5/5 in all except for L shoulder ER.    Time 6   Period Weeks   Status Achieved     PT LONG TERM GOAL #2   Title Patient to demonstrate correct mechanics for functional floor to overhead lift in order to reduce and prevent exacerbation of cervical pain for safe return to work    Time 6   Period Weeks   Status Achieved     PT LONG TERM GOAL #3   Title Patient to experience pain no more than 1/10 in order to improve general QOL and ability to sleep well at night    Baseline Pt reports she is able to do this now.    Time 6   Period Weeks   Status On-going     PT LONG TERM GOAL #4   Title Patient to report she has been able to return to full duty at work with no exacerbation in cervical pain past 1/10 in order to facilitate return to PLOF    Baseline Pt is planning to return to work as soon as she is able.    Time 6   Period Weeks   Status Unable to assess               Plan - 04/08/16 1505    Clinical Impression Statement Reassessment performed today to prepare for DC. Pt has achieved almost all goals. ROM, strength, and pain deficits have all recovered by at least 85%. Pt is pleased with current level of function and ready to return to work once cleared by the referring provider. Her advanced HEP and workplace ergonomics are reviewed in full with education and patient is independent to contnue improving remaining deficits.    Rehab Potential Good   Clinical Impairments Affecting Rehab Potential very small C7 avulsion fracture    PT Frequency Other (comment)   PT Treatment/Interventions ADLs/Self Care Home Management;Biofeedback;Cryotherapy;Moist Heat;Functional mobility training;Therapeutic  activities;Therapeutic exercise;Balance training;Neuromuscular re-education;Patient/family education;Manual techniques;Passive range of motion;Energy conservation;Taping;Dry needling   PT Home Exercise Plan 9/25: scapular retractions, backwards shoulder rolls, 3D cervical/thoracic excursions; 10/30: moved AAROM rotation/SB to supine with manual overpressure beyond point of pain. Update going forward.    Consulted and Agree with Plan of Care Patient      Patient will benefit from skilled therapeutic intervention in order to improve the following deficits and impairments:  Increased fascial restricitons, Improper body mechanics, Pain, Decreased mobility, Increased muscle spasms, Postural dysfunction, Decreased activity tolerance, Decreased range of motion, Decreased strength, Hypomobility, Impaired UE functional use, Impaired flexibility  Visit Diagnosis: Cervicalgia  Abnormal posture  Muscle weakness (generalized)  Cramp and spasm     Problem List There are no active problems to display for this patient.   3:22 PM, 04/08/16 Etta Grandchild, PT, DPT Physical Therapist at [place] [phone] (office)     [place], [place], [address] Phone: [phone]   Fax:  [phone]  Name: NOVALYN LAJARA MRN: [id number] Date of Birth: [birth year]

## 2016-04-08 NOTE — Telephone Encounter (Signed)
Requested notes from 11/6,10/3,10/25, and 10/23 be faxed to [facility] @ [phone] for worker's comp claim#[id number]

## 2016-10-15 ENCOUNTER — Other Ambulatory Visit (HOSPITAL_COMMUNITY): Payer: Self-pay | Admitting: Internal Medicine

## 2016-10-15 DIAGNOSIS — Z1231 Encounter for screening mammogram for malignant neoplasm of breast: Secondary | ICD-10-CM

## 2016-10-21 ENCOUNTER — Ambulatory Visit (HOSPITAL_COMMUNITY)
Admission: RE | Admit: 2016-10-21 | Discharge: 2016-10-21 | Disposition: A | Discharge status: Home / Self Care | Payer: BLUE CROSS/BLUE SHIELD | Source: Ambulatory Visit | Attending: Internal Medicine | Admitting: Internal Medicine

## 2016-10-21 DIAGNOSIS — Z1231 Encounter for screening mammogram for malignant neoplasm of breast: Secondary | ICD-10-CM | POA: Diagnosis present

## 2017-01-24 ENCOUNTER — Other Ambulatory Visit (HOSPITAL_COMMUNITY): Payer: Self-pay | Admitting: Internal Medicine

## 2017-01-24 DIAGNOSIS — Z780 Asymptomatic menopausal state: Secondary | ICD-10-CM

## 2017-02-06 ENCOUNTER — Other Ambulatory Visit (HOSPITAL_COMMUNITY): Payer: BLUE CROSS/BLUE SHIELD

## 2017-02-06 ENCOUNTER — Encounter (HOSPITAL_COMMUNITY): Payer: Self-pay

## 2018-08-17 ENCOUNTER — Ambulatory Visit (HOSPITAL_COMMUNITY)
Admission: RE | Admit: 2018-08-17 | Discharge: 2018-08-17 | Disposition: A | Discharge status: Home / Self Care | Payer: BLUE CROSS/BLUE SHIELD | Source: Ambulatory Visit | Attending: Adult Health Nurse Practitioner | Admitting: Adult Health Nurse Practitioner

## 2018-08-17 ENCOUNTER — Other Ambulatory Visit: Payer: Self-pay

## 2018-08-17 ENCOUNTER — Other Ambulatory Visit (HOSPITAL_COMMUNITY): Payer: Self-pay | Admitting: Adult Health Nurse Practitioner

## 2018-08-17 DIAGNOSIS — R52 Pain, unspecified: Secondary | ICD-10-CM | POA: Insufficient documentation

## 2018-08-26 ENCOUNTER — Other Ambulatory Visit: Payer: Self-pay | Admitting: Adult Health Nurse Practitioner

## 2018-08-26 DIAGNOSIS — M5136 Other intervertebral disc degeneration, lumbar region: Secondary | ICD-10-CM

## 2018-12-14 ENCOUNTER — Other Ambulatory Visit (HOSPITAL_COMMUNITY): Payer: Self-pay | Admitting: Adult Health Nurse Practitioner

## 2018-12-14 DIAGNOSIS — M5136 Other intervertebral disc degeneration, lumbar region: Secondary | ICD-10-CM

## 2018-12-21 ENCOUNTER — Ambulatory Visit: Payer: BC Managed Care – PPO

## 2018-12-22 ENCOUNTER — Ambulatory Visit (HOSPITAL_COMMUNITY): Payer: BC Managed Care – PPO

## 2020-10-11 DIAGNOSIS — G894 Chronic pain syndrome: Secondary | ICD-10-CM | POA: Insufficient documentation

## 2020-10-31 DIAGNOSIS — F411 Generalized anxiety disorder: Secondary | ICD-10-CM | POA: Insufficient documentation

## 2020-11-13 DIAGNOSIS — E785 Hyperlipidemia, unspecified: Secondary | ICD-10-CM | POA: Insufficient documentation

## 2020-11-13 DIAGNOSIS — I10 Essential (primary) hypertension: Secondary | ICD-10-CM | POA: Insufficient documentation

## 2020-11-20 ENCOUNTER — Other Ambulatory Visit (HOSPITAL_COMMUNITY): Payer: Self-pay | Admitting: Family Medicine

## 2020-11-20 DIAGNOSIS — R053 Chronic cough: Secondary | ICD-10-CM | POA: Insufficient documentation

## 2020-11-20 DIAGNOSIS — Z1231 Encounter for screening mammogram for malignant neoplasm of breast: Secondary | ICD-10-CM

## 2020-11-20 DIAGNOSIS — E875 Hyperkalemia: Secondary | ICD-10-CM | POA: Insufficient documentation

## 2020-11-20 DIAGNOSIS — E669 Obesity, unspecified: Secondary | ICD-10-CM | POA: Insufficient documentation

## 2020-11-27 ENCOUNTER — Other Ambulatory Visit: Payer: Self-pay

## 2020-11-27 ENCOUNTER — Ambulatory Visit (HOSPITAL_COMMUNITY)
Admission: RE | Admit: 2020-11-27 | Discharge: 2020-11-27 | Disposition: A | Discharge status: Home / Self Care | Payer: BC Managed Care – PPO | Source: Ambulatory Visit | Attending: Family Medicine | Admitting: Family Medicine

## 2020-11-27 DIAGNOSIS — Z1231 Encounter for screening mammogram for malignant neoplasm of breast: Secondary | ICD-10-CM

## 2020-12-03 LAB — COLOGUARD: COLOGUARD: NEGATIVE

## 2020-12-03 LAB — EXTERNAL GENERIC LAB PROCEDURE: COLOGUARD: NEGATIVE

## 2021-01-24 DIAGNOSIS — F329 Major depressive disorder, single episode, unspecified: Secondary | ICD-10-CM | POA: Insufficient documentation

## 2021-02-27 ENCOUNTER — Other Ambulatory Visit: Payer: Self-pay

## 2021-02-27 ENCOUNTER — Ambulatory Visit (HOSPITAL_COMMUNITY)
Admission: RE | Admit: 2021-02-27 | Discharge: 2021-02-27 | Disposition: A | Discharge status: Home / Self Care | Payer: BC Managed Care – PPO | Source: Ambulatory Visit | Attending: Family Medicine | Admitting: Family Medicine

## 2021-02-27 ENCOUNTER — Other Ambulatory Visit (HOSPITAL_COMMUNITY): Payer: Self-pay | Admitting: Family Medicine

## 2021-02-27 DIAGNOSIS — R059 Cough, unspecified: Secondary | ICD-10-CM | POA: Diagnosis not present

## 2021-02-27 DIAGNOSIS — R053 Chronic cough: Secondary | ICD-10-CM | POA: Diagnosis not present

## 2021-02-27 DIAGNOSIS — G8929 Other chronic pain: Secondary | ICD-10-CM | POA: Diagnosis not present

## 2021-02-27 DIAGNOSIS — F411 Generalized anxiety disorder: Secondary | ICD-10-CM | POA: Diagnosis not present

## 2021-05-01 DIAGNOSIS — G894 Chronic pain syndrome: Secondary | ICD-10-CM | POA: Diagnosis not present

## 2021-05-01 DIAGNOSIS — J0100 Acute maxillary sinusitis, unspecified: Secondary | ICD-10-CM | POA: Diagnosis not present

## 2021-06-25 DIAGNOSIS — M5441 Lumbago with sciatica, right side: Secondary | ICD-10-CM | POA: Diagnosis not present

## 2021-06-25 DIAGNOSIS — E663 Overweight: Secondary | ICD-10-CM | POA: Diagnosis not present

## 2021-07-20 DIAGNOSIS — E663 Overweight: Secondary | ICD-10-CM | POA: Diagnosis not present

## 2021-07-20 DIAGNOSIS — Z0001 Encounter for general adult medical examination with abnormal findings: Secondary | ICD-10-CM | POA: Diagnosis not present

## 2021-07-23 DIAGNOSIS — R7303 Prediabetes: Secondary | ICD-10-CM | POA: Insufficient documentation

## 2021-07-24 DIAGNOSIS — E669 Obesity, unspecified: Secondary | ICD-10-CM | POA: Diagnosis not present

## 2021-07-24 DIAGNOSIS — E875 Hyperkalemia: Secondary | ICD-10-CM | POA: Diagnosis not present

## 2021-07-24 DIAGNOSIS — E782 Mixed hyperlipidemia: Secondary | ICD-10-CM | POA: Diagnosis not present

## 2021-07-24 DIAGNOSIS — G8929 Other chronic pain: Secondary | ICD-10-CM | POA: Diagnosis not present

## 2021-08-07 DIAGNOSIS — I10 Essential (primary) hypertension: Secondary | ICD-10-CM | POA: Diagnosis not present

## 2021-10-16 DIAGNOSIS — I10 Essential (primary) hypertension: Secondary | ICD-10-CM | POA: Diagnosis not present

## 2021-10-16 DIAGNOSIS — F411 Generalized anxiety disorder: Secondary | ICD-10-CM | POA: Diagnosis not present

## 2022-01-07 ENCOUNTER — Other Ambulatory Visit (HOSPITAL_COMMUNITY): Payer: Self-pay | Admitting: Internal Medicine

## 2022-01-07 DIAGNOSIS — Z1231 Encounter for screening mammogram for malignant neoplasm of breast: Secondary | ICD-10-CM

## 2022-01-14 ENCOUNTER — Ambulatory Visit (HOSPITAL_COMMUNITY)
Admission: RE | Admit: 2022-01-14 | Discharge: 2022-01-14 | Disposition: A | Discharge status: Home / Self Care | Payer: BC Managed Care – PPO | Source: Ambulatory Visit | Attending: Internal Medicine | Admitting: Internal Medicine

## 2022-01-14 DIAGNOSIS — E785 Hyperlipidemia, unspecified: Secondary | ICD-10-CM | POA: Diagnosis not present

## 2022-01-14 DIAGNOSIS — Z1231 Encounter for screening mammogram for malignant neoplasm of breast: Secondary | ICD-10-CM | POA: Diagnosis not present

## 2022-01-14 DIAGNOSIS — R7303 Prediabetes: Secondary | ICD-10-CM | POA: Diagnosis not present

## 2022-01-14 DIAGNOSIS — I10 Essential (primary) hypertension: Secondary | ICD-10-CM | POA: Diagnosis not present

## 2022-01-21 DIAGNOSIS — Z0001 Encounter for general adult medical examination with abnormal findings: Secondary | ICD-10-CM | POA: Diagnosis not present

## 2022-01-21 DIAGNOSIS — M26609 Unspecified temporomandibular joint disorder, unspecified side: Secondary | ICD-10-CM | POA: Insufficient documentation

## 2022-07-16 DIAGNOSIS — I10 Essential (primary) hypertension: Secondary | ICD-10-CM | POA: Diagnosis not present

## 2022-07-16 DIAGNOSIS — E875 Hyperkalemia: Secondary | ICD-10-CM | POA: Diagnosis not present

## 2022-07-16 DIAGNOSIS — E785 Hyperlipidemia, unspecified: Secondary | ICD-10-CM | POA: Diagnosis not present

## 2022-07-16 DIAGNOSIS — R7303 Prediabetes: Secondary | ICD-10-CM | POA: Diagnosis not present

## 2022-07-21 DIAGNOSIS — E871 Hypo-osmolality and hyponatremia: Secondary | ICD-10-CM | POA: Insufficient documentation

## 2022-07-22 DIAGNOSIS — E875 Hyperkalemia: Secondary | ICD-10-CM | POA: Diagnosis not present

## 2022-07-22 DIAGNOSIS — F411 Generalized anxiety disorder: Secondary | ICD-10-CM | POA: Diagnosis not present

## 2022-07-22 DIAGNOSIS — E782 Mixed hyperlipidemia: Secondary | ICD-10-CM | POA: Diagnosis not present

## 2022-07-22 DIAGNOSIS — G4700 Insomnia, unspecified: Secondary | ICD-10-CM | POA: Insufficient documentation

## 2022-07-22 DIAGNOSIS — G8929 Other chronic pain: Secondary | ICD-10-CM | POA: Diagnosis not present

## 2022-07-22 DIAGNOSIS — E663 Overweight: Secondary | ICD-10-CM | POA: Diagnosis not present

## 2022-08-20 DIAGNOSIS — F411 Generalized anxiety disorder: Secondary | ICD-10-CM | POA: Diagnosis not present

## 2022-10-30 DIAGNOSIS — M79642 Pain in left hand: Secondary | ICD-10-CM | POA: Diagnosis not present

## 2022-10-30 DIAGNOSIS — F411 Generalized anxiety disorder: Secondary | ICD-10-CM | POA: Diagnosis not present

## 2022-10-30 DIAGNOSIS — M5431 Sciatica, right side: Secondary | ICD-10-CM | POA: Diagnosis not present

## 2022-10-30 DIAGNOSIS — M5432 Sciatica, left side: Secondary | ICD-10-CM | POA: Diagnosis not present

## 2022-10-30 DIAGNOSIS — M79641 Pain in right hand: Secondary | ICD-10-CM | POA: Insufficient documentation

## 2022-11-14 IMAGING — MG MM DIGITAL SCREENING BILAT W/ TOMO AND CAD
8 series · 8 of 24 positions shown · non-contrast
Comparison: Previous exam(s).

CLINICAL DATA: Screening.

EXAM:
DIGITAL SCREENING BILATERAL MAMMOGRAM WITH TOMOSYNTHESIS AND CAD
TECHNIQUE: Bilateral screening digital craniocaudal and mediolateral oblique
mammograms were obtained. Bilateral screening digital breast
tomosynthesis was performed. The images were evaluated with
computer-aided detection.

[L MLO synth-2D]
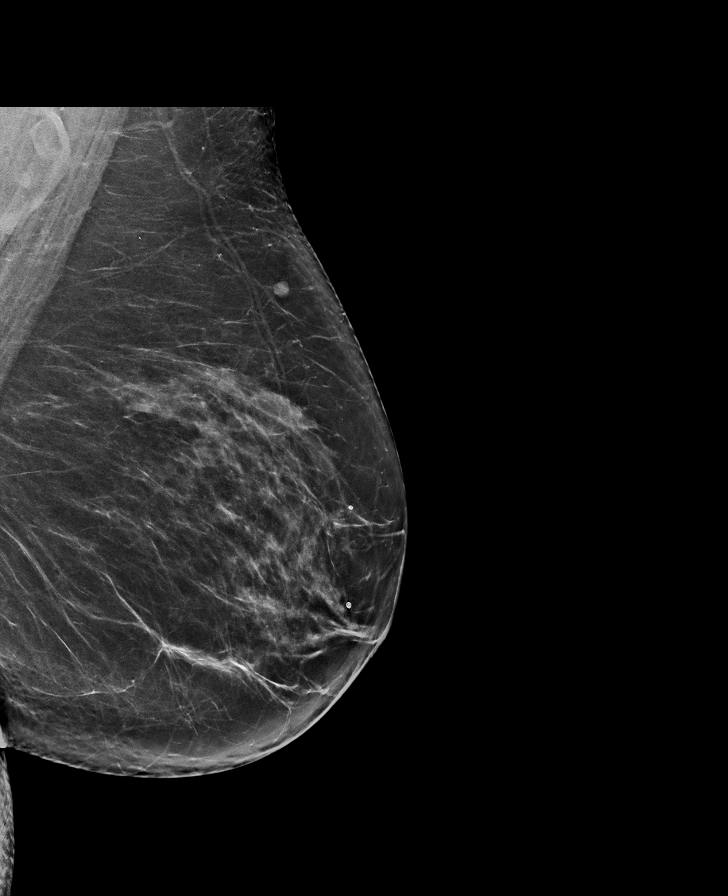

[L CC synth-2D]
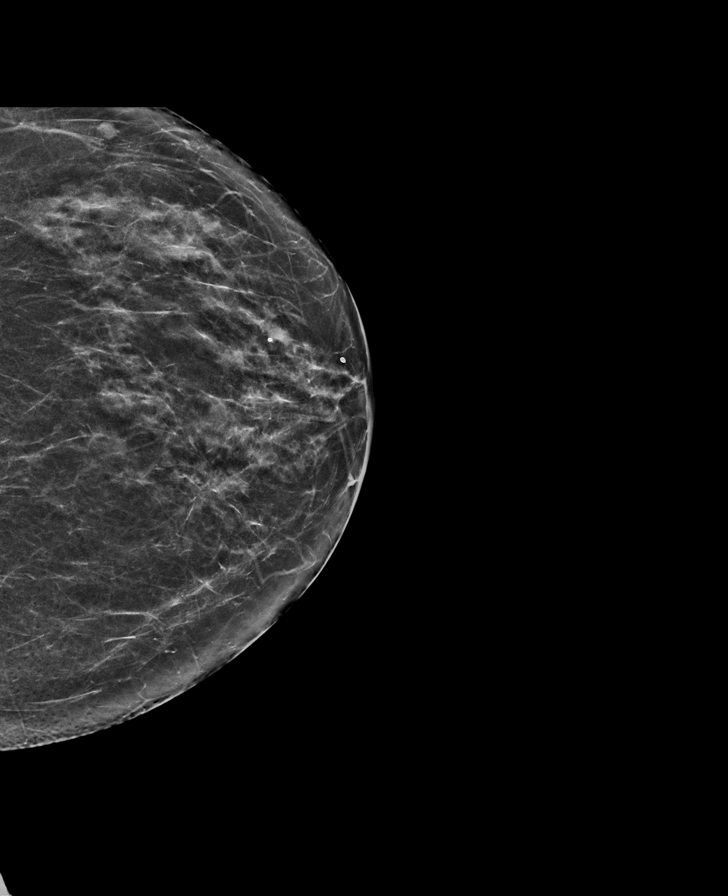

[R MLO synth-2D]
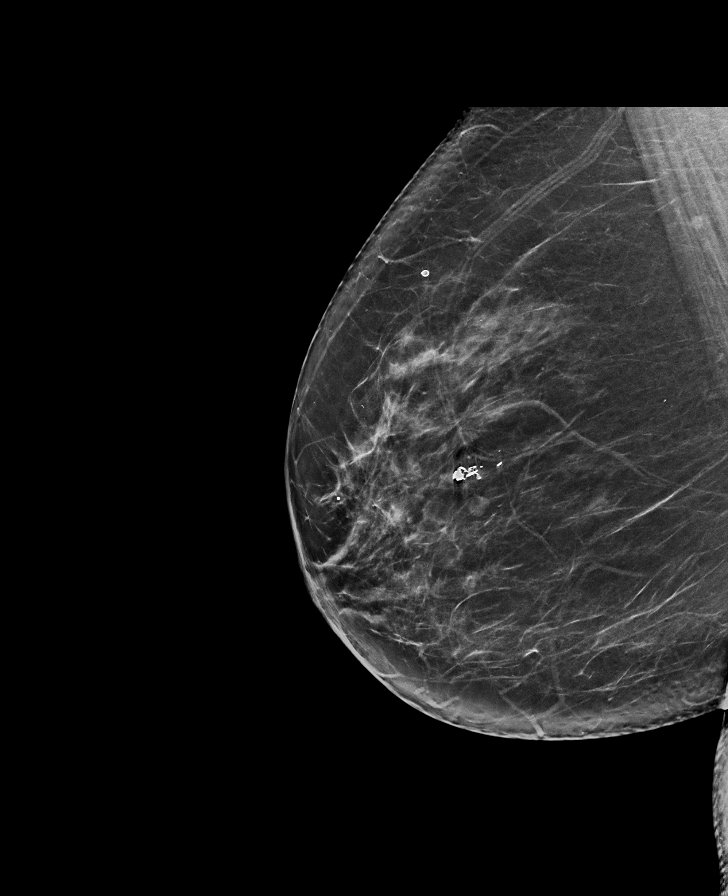

[R CC synth-2D]
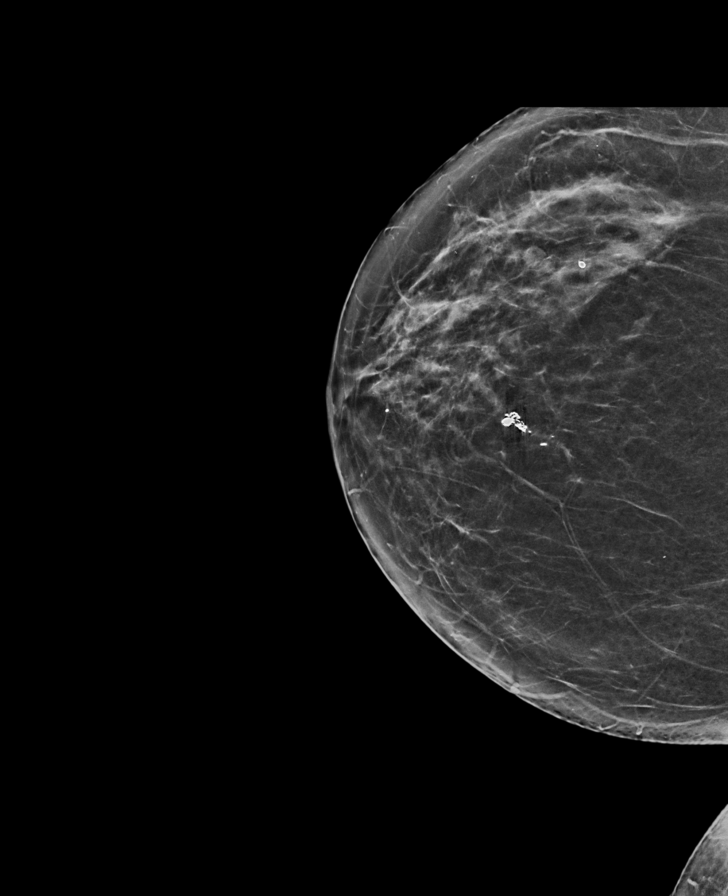

[L MLO tomo · tomo slice 39/78.0]
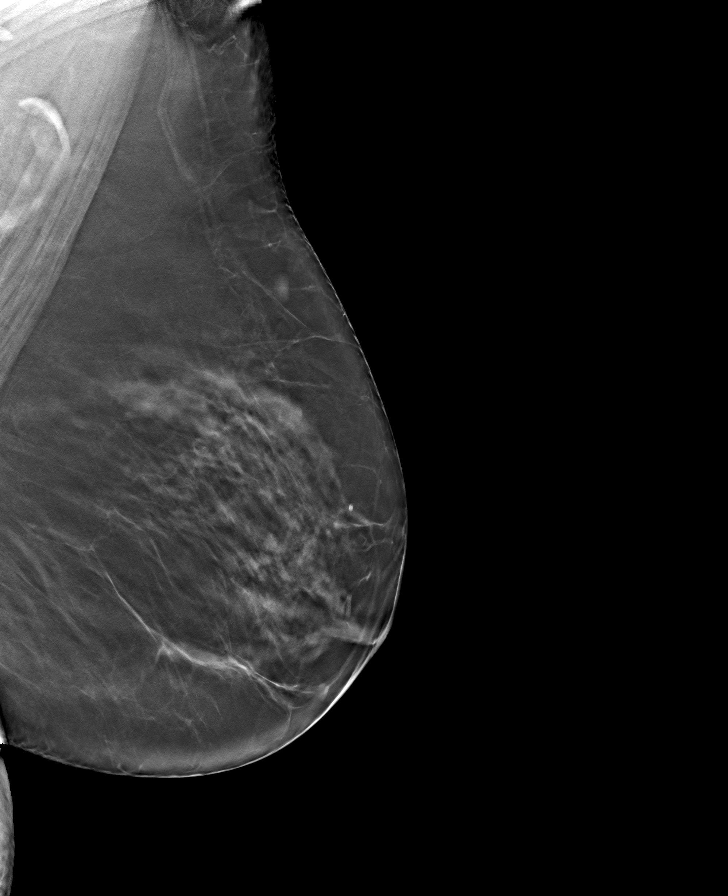

[L CC tomo · tomo slice 33/64.0]
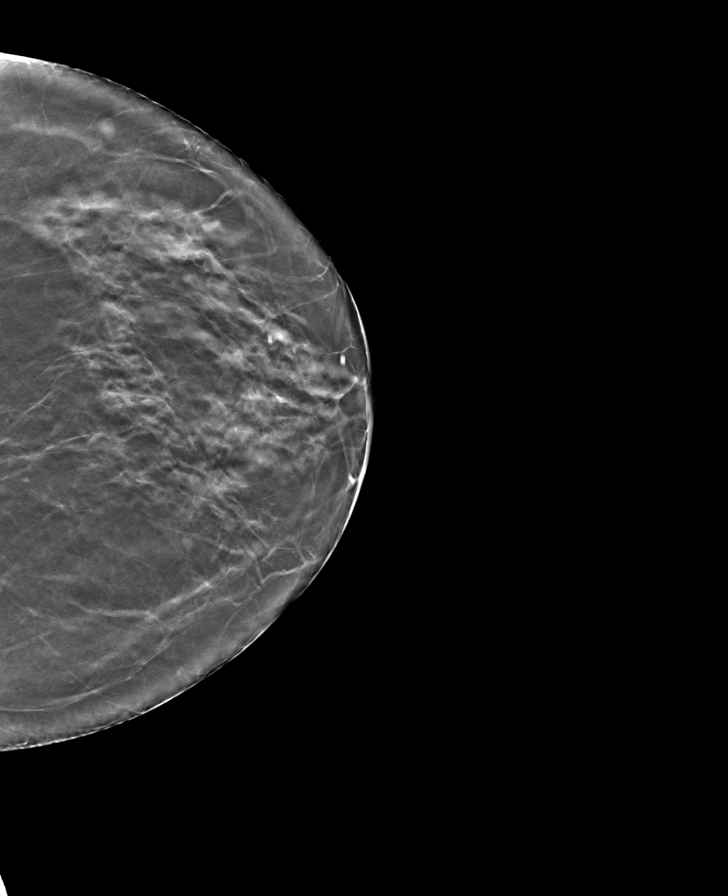

[R MLO tomo · tomo slice 39/78.0]
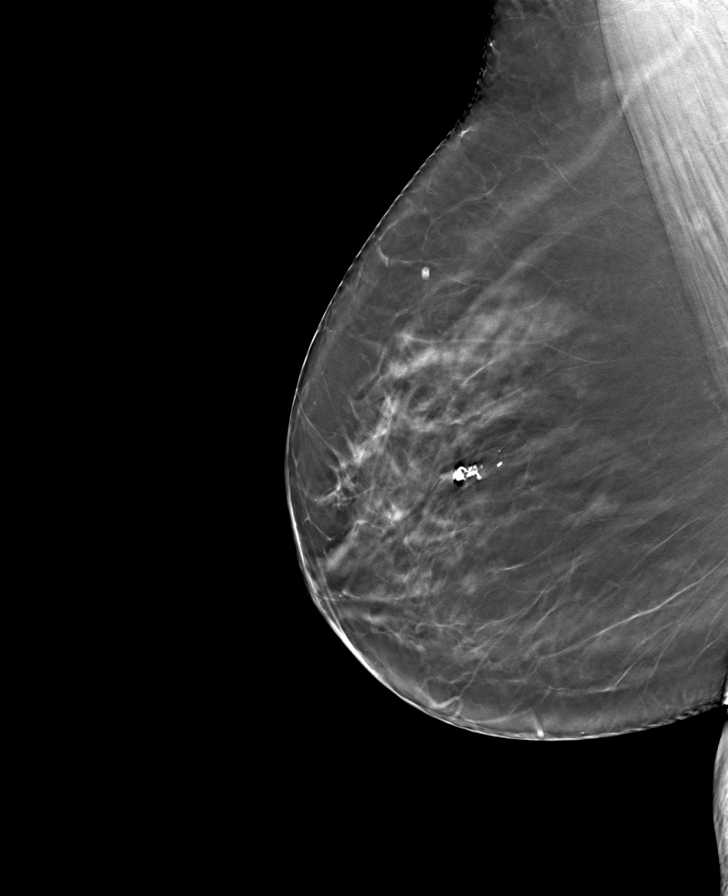

[R CC tomo · tomo slice 35/69.0]
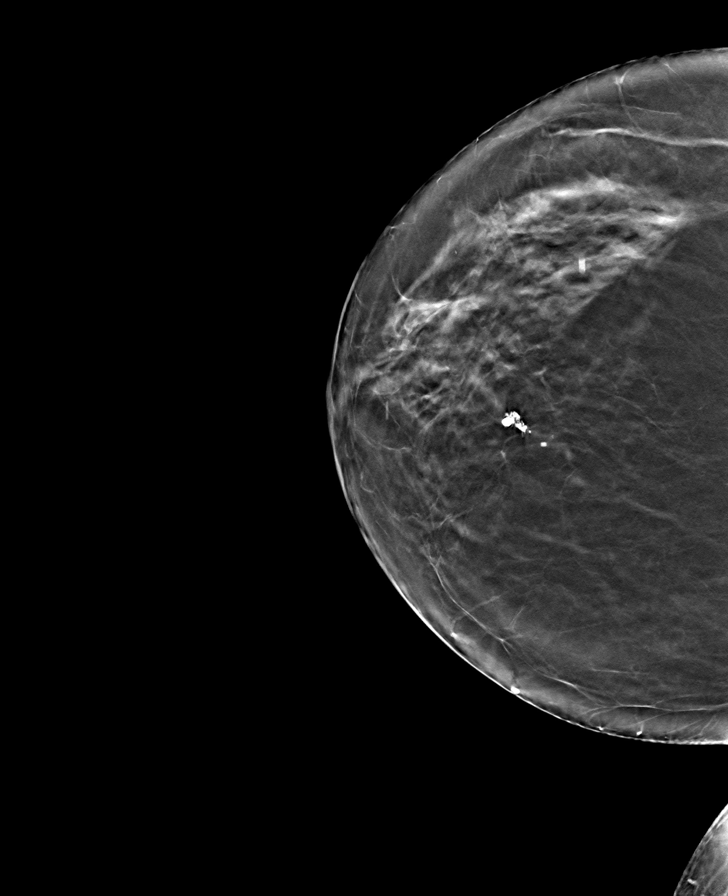

[8 of 24 positions shown; findings below may reference images not displayed]

ACR Breast Density Category b: There are scattered areas of
fibroglandular density.
FINDINGS: There are no findings suspicious for malignancy.
IMPRESSION: No mammographic evidence of malignancy. A result letter of this
screening mammogram will be mailed directly to the patient.

RECOMMENDATION:
Screening mammogram in one year. (Code:51-O-LD2)

BI-RADS CATEGORY  1: Negative.

## 2022-12-02 DIAGNOSIS — M5432 Sciatica, left side: Secondary | ICD-10-CM | POA: Diagnosis not present

## 2022-12-02 DIAGNOSIS — M5431 Sciatica, right side: Secondary | ICD-10-CM | POA: Diagnosis not present

## 2022-12-02 DIAGNOSIS — R300 Dysuria: Secondary | ICD-10-CM | POA: Diagnosis not present

## 2022-12-02 DIAGNOSIS — E782 Mixed hyperlipidemia: Secondary | ICD-10-CM | POA: Diagnosis not present

## 2022-12-03 DIAGNOSIS — R300 Dysuria: Secondary | ICD-10-CM | POA: Diagnosis not present

## 2023-01-16 DIAGNOSIS — R7303 Prediabetes: Secondary | ICD-10-CM | POA: Diagnosis not present

## 2023-01-16 DIAGNOSIS — I10 Essential (primary) hypertension: Secondary | ICD-10-CM | POA: Diagnosis not present

## 2023-01-16 DIAGNOSIS — E785 Hyperlipidemia, unspecified: Secondary | ICD-10-CM | POA: Diagnosis not present

## 2023-02-14 IMAGING — CR DG CHEST 2V
2 series · 2 of 2 positions shown · non-contrast
Comparison: 01/19/2016

CLINICAL DATA: Nonproductive cough for 4 months.

EXAM:
CHEST - 2 VIEW

[w pa chest]
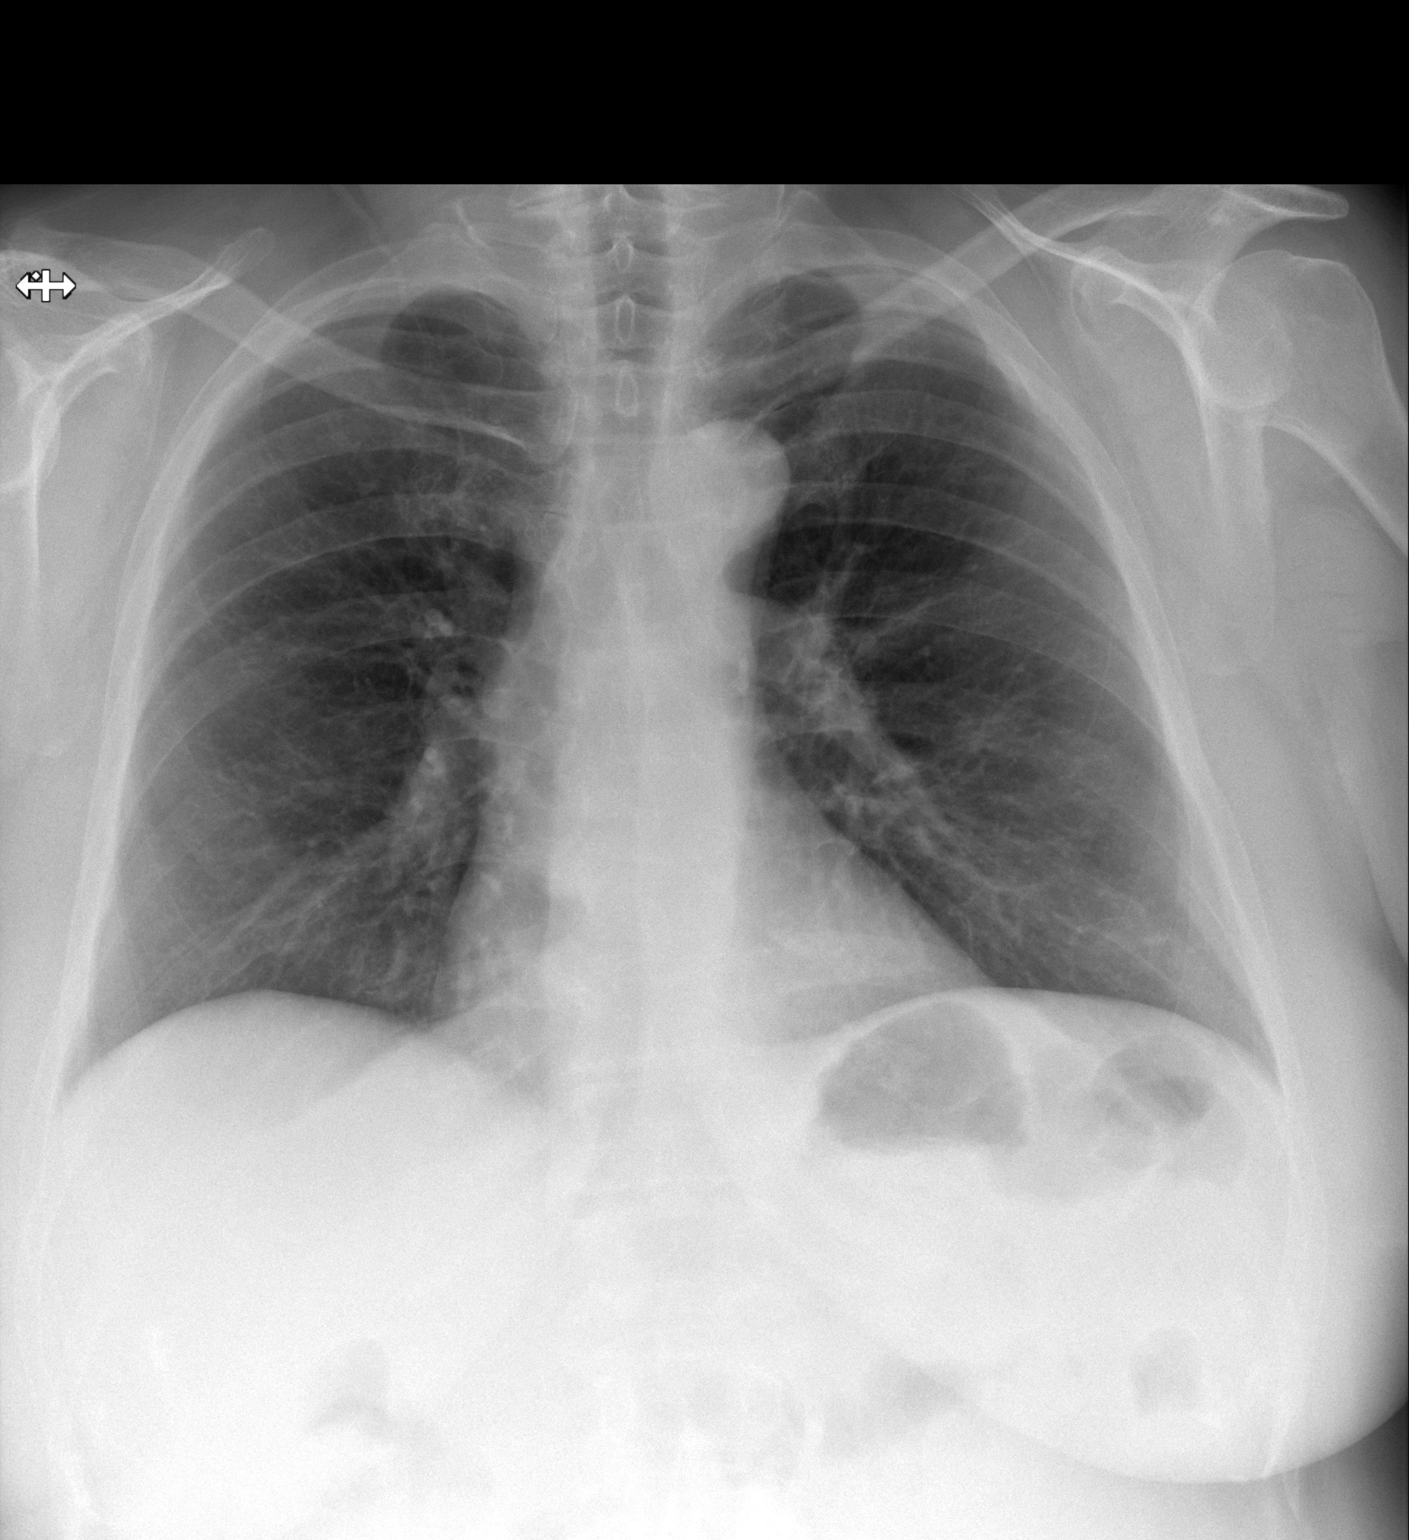

[w chest lat]
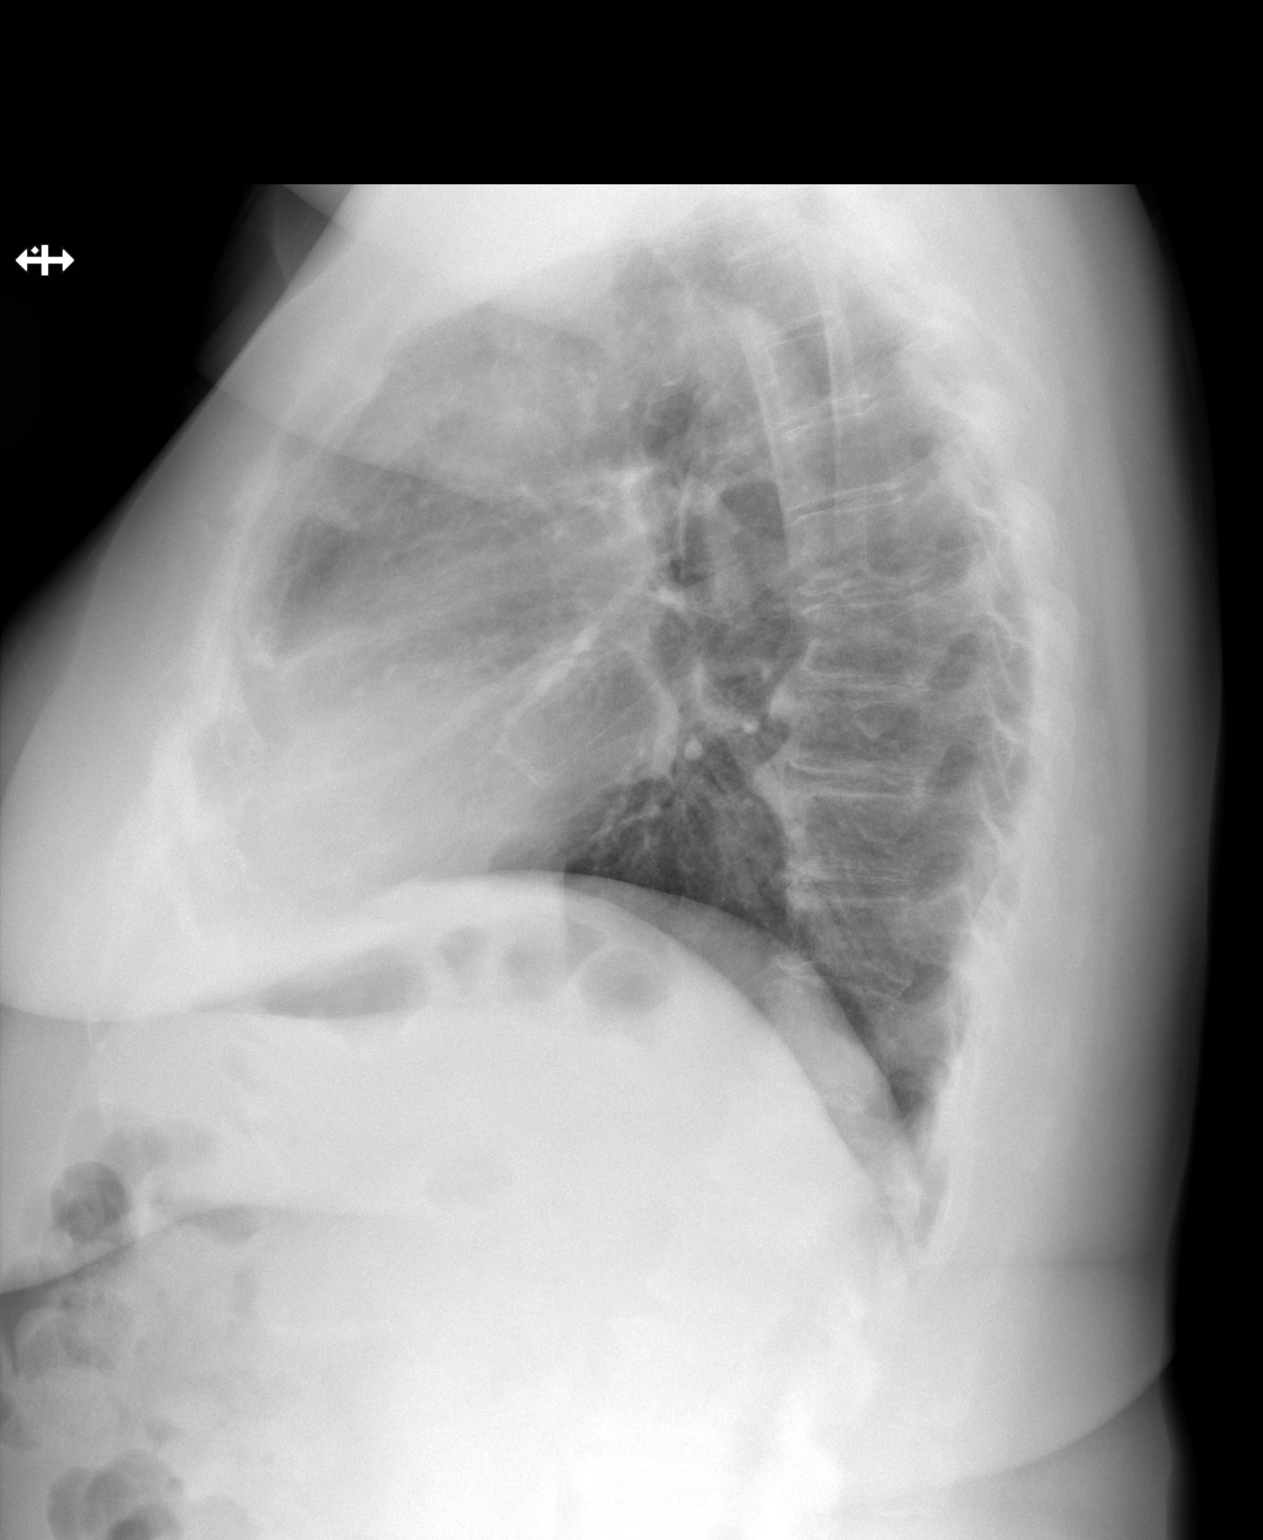

[2 of 2 positions shown; findings below may reference images not displayed]

FINDINGS: The heart size and mediastinal contours are within normal limits.
Both lungs are clear. The visualized skeletal structures are
unremarkable.
IMPRESSION: No active cardiopulmonary disease.

## 2023-02-18 DIAGNOSIS — Z0001 Encounter for general adult medical examination with abnormal findings: Secondary | ICD-10-CM | POA: Diagnosis not present

## 2023-02-18 DIAGNOSIS — G8929 Other chronic pain: Secondary | ICD-10-CM | POA: Diagnosis not present

## 2023-02-18 DIAGNOSIS — E782 Mixed hyperlipidemia: Secondary | ICD-10-CM | POA: Diagnosis not present

## 2023-02-18 DIAGNOSIS — G629 Polyneuropathy, unspecified: Secondary | ICD-10-CM | POA: Insufficient documentation

## 2023-02-18 DIAGNOSIS — Z0000 Encounter for general adult medical examination without abnormal findings: Secondary | ICD-10-CM | POA: Diagnosis not present

## 2023-02-18 DIAGNOSIS — F411 Generalized anxiety disorder: Secondary | ICD-10-CM | POA: Diagnosis not present

## 2023-02-18 DIAGNOSIS — E663 Overweight: Secondary | ICD-10-CM | POA: Diagnosis not present

## 2023-03-05 DIAGNOSIS — S60468A Insect bite (nonvenomous) of other finger, initial encounter: Secondary | ICD-10-CM | POA: Diagnosis not present

## 2023-03-05 DIAGNOSIS — T63463A Toxic effect of venom of wasps, assault, initial encounter: Secondary | ICD-10-CM | POA: Diagnosis not present

## 2023-03-05 DIAGNOSIS — T63461A Toxic effect of venom of wasps, accidental (unintentional), initial encounter: Secondary | ICD-10-CM | POA: Diagnosis not present

## 2023-06-17 ENCOUNTER — Ambulatory Visit: Payer: HMO | Admitting: Orthopedic Surgery

## 2023-06-17 ENCOUNTER — Encounter: Payer: Self-pay | Admitting: Orthopedic Surgery

## 2023-06-17 ENCOUNTER — Other Ambulatory Visit (INDEPENDENT_AMBULATORY_CARE_PROVIDER_SITE_OTHER): Payer: Self-pay

## 2023-06-17 VITALS — Ht 66.0 in | Wt 174.0 lb

## 2023-06-17 DIAGNOSIS — M25562 Pain in left knee: Secondary | ICD-10-CM | POA: Diagnosis not present

## 2023-06-17 NOTE — Patient Instructions (Signed)

## 2023-06-17 NOTE — Progress Notes (Signed)
 New Patient Visit  Assessment: Lisa Collins is a 68 y.o. female with the following: 1. Acute pain of left knee  Plan: Lisa Collins has pain in the left knee.  It has been primarily within the medial aspect of the left knee for the past 2 months.  No specific injury.  We reviewed radiographs in clinic today which show early signs of arthritis.  No swelling on exam.  However, she does have tenderness to palpation along the medial joint line.  We discussed multiple treatment options including medications, topical treatments, bracing, heat, ice, physical therapy or injections.  She would like to continue with bracing and topical treatments.  She was concerned that something had been injured, but I assured her that the x-rays look pretty good overall.  She is relieved.  She will return to clinic as needed.  Follow-up: Return if symptoms worsen or fail to improve.  Subjective:  Chief Complaint  Patient presents with   Knee Pain    L knee pain for 2 mos. Pt states she works at [employer] and stands for 8 hrs a day     History of Present Illness: Lisa Collins is a 68 y.o. female who has been referred by Darlyn Hurst, MD for evaluation of left knee pain.  She states that she has had pain in the medial aspect of the left knee for the past 2 months.  No specific injury.  She did not notice any swelling.  Since the onset of pain, she has tried a compression sleeve, as well as some pain medicine.  This is not improving her symptoms.  She works and stands all day.  This makes her pain worse.  She was evaluated by Dr. Hurst, and subsequently referred to our clinic.  She has not worked with therapy.  No specific exercises.  She has not had an injection.   Review of Systems: No fevers or chills No numbness or tingling No chest pain No shortness of breath No bowel or bladder dysfunction No GI distress No headaches   Medical History:  Hypertension. Elevated cholesterol   History reviewed. No  pertinent past medical history.  History reviewed. No pertinent surgical history.  History reviewed. No pertinent family history. Social History   Tobacco Use   Smoking status: Never  Substance Use Topics   Alcohol use: No   Drug use: No    Allergies  Allergen Reactions   Sulfa Antibiotics Nausea And Vomiting    Current Meds  Medication Sig   ALPRAZolam (XANAX) 0.25 MG tablet Take 0.25 mg by mouth at bedtime as needed for anxiety.   atorvastatin (LIPITOR) 10 MG tablet Take 10 mg by mouth daily.   gabapentin (NEURONTIN) 300 MG capsule Take 300 mg by mouth 3 (three) times daily.   hydrochlorothiazide (HYDRODIURIL) 25 MG tablet Take 25 mg by mouth daily.   HYDROcodone -acetaminophen  (NORCO/VICODIN) 5-325 MG tablet Take 1 tablet by mouth every 4 (four) hours as needed.   methocarbamol  (ROBAXIN ) 500 MG tablet Take 1 tablet (500 mg total) by mouth 3 (three) times daily.   nabumetone (RELAFEN) 500 MG tablet Take 500 mg by mouth daily.   sertraline (ZOLOFT) 100 MG tablet Take 100 mg by mouth daily.    Objective: Ht 5' 6 (1.676 m)   Wt 174 lb (78.9 kg)   BMI 28.08 kg/m   Physical Exam:  General: Alert and oriented. and No acute distress. Gait: Left sided antalgic gait.  Evaluation left knee demonstrates no swelling.  No redness.  No bruising.  She has good range of motion.  No increased laxity varus or valgus stress.  No tenderness to palpation over the proximal tibia.  Tenderness palpation along the medial joint line.  Negative Lachman.  IMAGING: I personally ordered and reviewed the following images  X-rays of the left knee were obtained in clinic today.  No acute injuries.  Neutral overall alignment.  Mild loss of joint space in the medial compartment.  Small osteophytes are appreciated.  No bony lesions.  Impression: Negative left knee x-ray   New Medications:  No orders of the defined types were placed in this encounter.     Oneil DELENA Horde, MD  06/17/2023 2:08  PM

## 2023-08-19 DIAGNOSIS — H524 Presbyopia: Secondary | ICD-10-CM | POA: Diagnosis not present

## 2023-08-19 DIAGNOSIS — R7301 Impaired fasting glucose: Secondary | ICD-10-CM | POA: Diagnosis not present

## 2023-08-19 DIAGNOSIS — E785 Hyperlipidemia, unspecified: Secondary | ICD-10-CM | POA: Diagnosis not present

## 2023-08-19 DIAGNOSIS — H4322 Crystalline deposits in vitreous body, left eye: Secondary | ICD-10-CM | POA: Diagnosis not present

## 2023-08-19 DIAGNOSIS — H2513 Age-related nuclear cataract, bilateral: Secondary | ICD-10-CM | POA: Diagnosis not present

## 2023-08-19 DIAGNOSIS — I10 Essential (primary) hypertension: Secondary | ICD-10-CM | POA: Diagnosis not present

## 2023-08-19 DIAGNOSIS — H5203 Hypermetropia, bilateral: Secondary | ICD-10-CM | POA: Diagnosis not present

## 2023-08-25 DIAGNOSIS — G4700 Insomnia, unspecified: Secondary | ICD-10-CM | POA: Diagnosis not present

## 2023-08-25 DIAGNOSIS — M25562 Pain in left knee: Secondary | ICD-10-CM | POA: Diagnosis not present

## 2023-08-25 DIAGNOSIS — G629 Polyneuropathy, unspecified: Secondary | ICD-10-CM | POA: Diagnosis not present

## 2023-08-25 DIAGNOSIS — F411 Generalized anxiety disorder: Secondary | ICD-10-CM | POA: Diagnosis not present

## 2023-08-25 DIAGNOSIS — M25559 Pain in unspecified hip: Secondary | ICD-10-CM | POA: Diagnosis not present

## 2023-08-25 DIAGNOSIS — E785 Hyperlipidemia, unspecified: Secondary | ICD-10-CM | POA: Diagnosis not present

## 2023-08-25 DIAGNOSIS — R7303 Prediabetes: Secondary | ICD-10-CM | POA: Diagnosis not present

## 2023-08-25 DIAGNOSIS — G8929 Other chronic pain: Secondary | ICD-10-CM | POA: Diagnosis not present

## 2023-10-20 DIAGNOSIS — R509 Fever, unspecified: Secondary | ICD-10-CM | POA: Diagnosis not present

## 2023-11-25 DIAGNOSIS — F5105 Insomnia due to other mental disorder: Secondary | ICD-10-CM | POA: Diagnosis not present

## 2023-11-25 DIAGNOSIS — G8929 Other chronic pain: Secondary | ICD-10-CM | POA: Diagnosis not present

## 2023-11-25 DIAGNOSIS — F419 Anxiety disorder, unspecified: Secondary | ICD-10-CM | POA: Diagnosis not present

## 2023-11-25 DIAGNOSIS — M25559 Pain in unspecified hip: Secondary | ICD-10-CM | POA: Diagnosis not present

## 2024-01-01 ENCOUNTER — Other Ambulatory Visit (HOSPITAL_COMMUNITY): Payer: Self-pay | Admitting: Family Medicine

## 2024-01-01 DIAGNOSIS — M5430 Sciatica, unspecified side: Secondary | ICD-10-CM | POA: Diagnosis not present

## 2024-01-01 DIAGNOSIS — M25552 Pain in left hip: Secondary | ICD-10-CM

## 2024-01-01 DIAGNOSIS — G4700 Insomnia, unspecified: Secondary | ICD-10-CM | POA: Diagnosis not present

## 2024-01-01 DIAGNOSIS — M5440 Lumbago with sciatica, unspecified side: Secondary | ICD-10-CM | POA: Diagnosis not present

## 2024-01-01 DIAGNOSIS — G8929 Other chronic pain: Secondary | ICD-10-CM | POA: Diagnosis not present

## 2024-01-02 ENCOUNTER — Ambulatory Visit (HOSPITAL_COMMUNITY)
Admission: RE | Admit: 2024-01-02 | Discharge: 2024-01-02 | Disposition: A | Discharge status: Home / Self Care | Source: Ambulatory Visit | Attending: Family Medicine | Admitting: Family Medicine

## 2024-01-02 ENCOUNTER — Encounter (HOSPITAL_COMMUNITY): Payer: Self-pay

## 2024-01-02 DIAGNOSIS — M25552 Pain in left hip: Secondary | ICD-10-CM

## 2024-01-03 ENCOUNTER — Ambulatory Visit (HOSPITAL_COMMUNITY)
Admission: RE | Admit: 2024-01-03 | Discharge: 2024-01-03 | Disposition: A | Discharge status: Home / Self Care | Source: Ambulatory Visit | Attending: Family Medicine | Admitting: Family Medicine

## 2024-01-03 DIAGNOSIS — M25552 Pain in left hip: Secondary | ICD-10-CM | POA: Diagnosis not present

## 2024-01-03 DIAGNOSIS — M25551 Pain in right hip: Secondary | ICD-10-CM | POA: Diagnosis not present

## 2024-01-22 DIAGNOSIS — Z1211 Encounter for screening for malignant neoplasm of colon: Secondary | ICD-10-CM | POA: Diagnosis not present

## 2024-01-29 LAB — COLOGUARD: COLOGUARD: NEGATIVE

## 2024-02-09 ENCOUNTER — Other Ambulatory Visit (INDEPENDENT_AMBULATORY_CARE_PROVIDER_SITE_OTHER): Payer: Self-pay

## 2024-02-09 ENCOUNTER — Ambulatory Visit: Admitting: Orthopedic Surgery

## 2024-02-09 ENCOUNTER — Encounter: Payer: Self-pay | Admitting: Orthopedic Surgery

## 2024-02-09 VITALS — BP 128/76 | HR 72 | Ht 67.0 in | Wt 180.0 lb

## 2024-02-09 DIAGNOSIS — M545 Low back pain, unspecified: Secondary | ICD-10-CM
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
## 2024-02-09 NOTE — Patient Instructions (Addendum)
 We do not recommend surgery for the problem you are having  You are having some issues with your lower back and it is causing some nerve impingement.  We recommend physical therapy, heating pad walking for exercise  If you do have further problems please have your physician refer you to a neurosurgeon or orthopedic spine surgeon.  We do not have that capability in our [place] office  Physical therapy has been ordered for you at [facility]. They should call you to schedule, [phone] is the phone number to call, if you want to call to schedule.

## 2024-02-23 DIAGNOSIS — I10 Essential (primary) hypertension: Secondary | ICD-10-CM | POA: Diagnosis not present

## 2024-02-23 DIAGNOSIS — R7303 Prediabetes: Secondary | ICD-10-CM | POA: Diagnosis not present

## 2024-03-08 ENCOUNTER — Other Ambulatory Visit (HOSPITAL_COMMUNITY): Payer: Self-pay | Admitting: Nurse Practitioner

## 2024-03-08 DIAGNOSIS — G629 Polyneuropathy, unspecified: Secondary | ICD-10-CM | POA: Diagnosis not present

## 2024-03-08 DIAGNOSIS — E785 Hyperlipidemia, unspecified: Secondary | ICD-10-CM

## 2024-03-08 DIAGNOSIS — F411 Generalized anxiety disorder: Secondary | ICD-10-CM | POA: Diagnosis not present

## 2024-03-08 DIAGNOSIS — G8929 Other chronic pain: Secondary | ICD-10-CM | POA: Diagnosis not present

## 2024-03-08 DIAGNOSIS — M5430 Sciatica, unspecified side: Secondary | ICD-10-CM | POA: Diagnosis not present

## 2024-03-08 DIAGNOSIS — Z0001 Encounter for general adult medical examination with abnormal findings: Secondary | ICD-10-CM | POA: Diagnosis not present

## 2024-03-08 DIAGNOSIS — E875 Hyperkalemia: Secondary | ICD-10-CM | POA: Diagnosis not present

## 2024-03-08 DIAGNOSIS — M25562 Pain in left knee: Secondary | ICD-10-CM | POA: Diagnosis not present

## 2024-03-08 DIAGNOSIS — R7303 Prediabetes: Secondary | ICD-10-CM | POA: Diagnosis not present

## 2024-03-08 DIAGNOSIS — Z13820 Encounter for screening for osteoporosis: Secondary | ICD-10-CM

## 2024-03-08 DIAGNOSIS — Z0000 Encounter for general adult medical examination without abnormal findings: Secondary | ICD-10-CM | POA: Diagnosis not present

## 2024-03-08 DIAGNOSIS — F5105 Insomnia due to other mental disorder: Secondary | ICD-10-CM | POA: Diagnosis not present

## 2024-03-09 ENCOUNTER — Ambulatory Visit (HOSPITAL_COMMUNITY)
Admission: RE | Admit: 2024-03-09 | Discharge: 2024-03-09 | Disposition: A | Discharge status: Home / Self Care | Source: Ambulatory Visit | Attending: Nurse Practitioner | Admitting: Nurse Practitioner

## 2024-03-09 ENCOUNTER — Ambulatory Visit (HOSPITAL_COMMUNITY): Attending: Orthopedic Surgery

## 2024-03-09 ENCOUNTER — Encounter (HOSPITAL_COMMUNITY): Payer: Self-pay

## 2024-03-09 ENCOUNTER — Other Ambulatory Visit (HOSPITAL_COMMUNITY): Payer: Self-pay | Admitting: Internal Medicine

## 2024-03-09 DIAGNOSIS — M25551 Pain in right hip: Secondary | ICD-10-CM | POA: Diagnosis not present

## 2024-03-09 DIAGNOSIS — Z1231 Encounter for screening mammogram for malignant neoplasm of breast: Secondary | ICD-10-CM

## 2024-03-09 DIAGNOSIS — M545 Low back pain, unspecified: Secondary | ICD-10-CM | POA: Insufficient documentation

## 2024-03-09 DIAGNOSIS — M5459 Other low back pain: Secondary | ICD-10-CM | POA: Insufficient documentation

## 2024-03-09 DIAGNOSIS — M8589 Other specified disorders of bone density and structure, multiple sites: Secondary | ICD-10-CM | POA: Insufficient documentation

## 2024-03-09 DIAGNOSIS — M85832 Other specified disorders of bone density and structure, left forearm: Secondary | ICD-10-CM | POA: Diagnosis not present

## 2024-03-09 DIAGNOSIS — Z13820 Encounter for screening for osteoporosis: Secondary | ICD-10-CM | POA: Insufficient documentation

## 2024-03-09 DIAGNOSIS — Z780 Asymptomatic menopausal state: Secondary | ICD-10-CM | POA: Diagnosis not present

## 2024-03-09 NOTE — Therapy (Signed)
 OUTPATIENT PHYSICAL THERAPY THORACOLUMBAR EVALUATION   Patient Name: Lisa Collins MRN: [id number] DOB:[birth year], 68 y.o., female Today's Date: 03/09/2024  END OF SESSION:  PT End of Session - 03/09/24 1030     Visit Number 1    Number of Visits 12    Date for Recertification  05/28/24    Authorization Type [employer]    Authorization Time Period no auth required    Progress Note Due on Visit 10    PT Start Time 1031    PT Stop Time 1117    PT Time Calculation (min) 46 min    Activity Tolerance Patient tolerated treatment well    Behavior During Therapy [facility] for tasks assessed/performed          History reviewed. No pertinent past medical history. History reviewed. No pertinent surgical history. 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   Prediabetes 07/23/2021   Major depressive disorder 01/24/2021   Hyperkalemia 11/20/2020   Obesity 11/20/2020   Persistent cough 11/20/2020   Essential hypertension 11/13/2020   Hyperlipidemia 11/13/2020   Generalized anxiety disorder 10/31/2020   Chronic pain syndrome 10/11/2020    PCP: Shona Norleen PEDLAR, MD  REFERRING PROVIDER: Margrette Taft BRAVO, MD   REFERRING DIAG: Acute right-sided low back pain without sciatica   Rationale for Evaluation and Treatment: Rehabilitation  THERAPY DIAG:  Other low back pain  Pain in right hip  ONSET DATE: over 6 months  SUBJECTIVE:                                                                                                                                                                                           SUBJECTIVE STATEMENT: Patient reports that she has right hip pain that radiates around to her groin. However, she was told that this pain was due to her  back and not her hip. She has had multiple x-rays on her back and hip. Her pain started over 6 months with no known cause. However, it seems to be slowly getting worse especially when she stands for long periods. Her pain is always there, but it gets worse throughout the day. She notes that she will start to shuffle her feet when her pain gets bad. She also has problems getting up from the floor when she cleans.   PERTINENT HISTORY:  Hypertension, neuropathy, and history of depression  PAIN:  Are you having pain? Yes: NPRS scale:  Current: 0/10 Worst: 10/10 Pain location: right hip and groin Pain description: constant, sharp Aggravating factors: prolonged standing (couple hours), sweeping, mopping, job demands Relieving factors: medication, sitting  PRECAUTIONS: None  RED FLAGS: None   WEIGHT BEARING RESTRICTIONS: No  FALLS:  Has patient fallen in last 6 months? No  LIVING ENVIRONMENT: Lives with: lives with their spouse Lives in: House/apartment Stairs: Yes: Internal: 16 steps; can reach both and External: 4-6 steps; none; internal steps are down to her laundry room  Has following equipment at home: Grab bars  OCCUPATION: customer host at [employer]   PLOF: Independent  PATIENT GOALS: reduced pain and be able to stand longer  NEXT MD VISIT: 06/09/23  OBJECTIVE:  Note: Objective measures were completed at Evaluation unless otherwise noted.  DIAGNOSTIC FINDINGS: 02/09/24 lumbar x-ray  Impression mild to moderate spondylosis L4-S1   PATIENT SURVEYS:  Modified Oswestry:  MODIFIED OSWESTRY DISABILITY SCALE  Date: 03/09/24 Score  Pain intensity 3 =  Pain medication provides me with moderate relief from pain.  2. Personal care (washing, dressing, etc.) 1 =  I can take care of myself normally, but it increases my pain.  3. Lifting 4 = I can lift only very light weights  4. Walking 3 =  Pain prevents me from walking more than  mile.  5. Sitting 0 =  I can sit in any chair as long  as I like.  6. Standing 1 =  I can stand as long as I want but, it increases my pain.  7. Sleeping 1 = I can sleep well only by using pain medication.  8. Social Life 1 =  My social life is normal, but it increases my level of pain.  9. Traveling 4 = My pain restricts my travel to short necessary journeys under 1/2 hour.  10. Employment/ Homemaking 1 = My normal homemaking/job activities increase my pain, but I can still perform all that is required of me  Total 19/50   Interpretation of scores: Score Category Description  0-20% Minimal Disability The patient can cope with most living activities. Usually no treatment is indicated apart from advice on lifting, sitting and exercise  21-40% Moderate Disability The patient experiences more pain and difficulty with sitting, lifting and standing. Travel and social life are more difficult and they may be disabled from work. Personal care, sexual activity and sleeping are not grossly affected, and the patient can usually be managed by conservative means  41-60% Severe Disability Pain remains the main problem in this group, but activities of daily living are affected. These patients require a detailed investigation  61-80% Crippled Back pain impinges on all aspects of the patient's life. Positive intervention is required  81-100% Bed-bound  These patients are either bed-bound or exaggerating their symptoms  Bluford FORBES Zoe DELENA Karon DELENA, et al. Surgery versus conservative management of stable thoracolumbar fracture: the PRESTO feasibility RCT. [place] ([place]): [employer]; 2021 Nov. [facility] Technology Assessment, No. 25.62.) Appendix 3, Oswestry Disability Index category descriptors. Available from: FindJewelers.cz  Minimally Clinically Important Difference (MCID) = 12.8%  COGNITION: Overall cognitive status: Within functional limits for tasks assessed     SENSATION: Patient reports numbness in her fingertips.    POSTURE: No Significant postural limitations  PALPATION: TTP: right lumbar paraspinals (familiar pain), QL (familiar pain)   LUMBAR JOINT MOBILITY:  L1-3: WFL and nonpainful  L3-5: hypomobile and familiar pain  LUMBAR ROM:   AROM eval  Flexion 50% limited; familiar pain  Extension  WFL Repeated extension (x10): reduced familiar pain  Right lateral flexion 25% limited  Left lateral flexion 50% limited; painful   Right rotation 25% limited; familiar pain  Left rotation 25% limited; familiar pain   (Blank rows = not tested)  LOWER EXTREMITY ROM:  WFL for activities assessed  LOWER EXTREMITY MMT:    MMT Right eval Left eval  Hip flexion 4-/5: familiar pain 4/5; knee pain  Hip extension    Hip abduction    Hip adduction    Hip internal rotation    Hip external rotation    Knee flexion 4/5 4/5  Knee extension 4/5; knee pain  4/5  Ankle dorsiflexion 4/5 4/5  Ankle plantarflexion    Ankle inversion    Ankle eversion     (Blank rows = not tested)  GAIT: Assistive device utilized: None Level of assistance: Complete Independence Comments: decreased stride length with poor hip extension and toe off bilaterally  TREATMENT DATE:                                                                                                                                  PATIENT EDUCATION:  Education details: POC, HEP, prognosis, objective findings, and goals for physical therapy Person educated: Patient Education method: Explanation, Demonstration, and Handouts Education comprehension: verbalized understanding and returned demonstration  HOME EXERCISE PROGRAM: Access Code: [email or web address] URL: [email or web address] Date: 03/09/2024 Prepared by: Lacinda Fass  Exercises - Standing Thoracic Open Book at Wall  - 2-3 x daily - 7 x weekly - 2 sets - 10 reps - Standing Lumbar Extension at Wall - Forearms  - 2-3 x daily - 7 x weekly - 2 sets - 10 reps  ASSESSMENT:  CLINICAL  IMPRESSION: Patient is a 68 y.o. female who was seen today for physical therapy evaluation and treatment for low back pain with referred symptoms into her right hip. She presented with low pain severity and moderate irritability with lumbar flexion and palpation to her right lumbar paraspinals reproducing her familiar symptoms. However, her familiar symptoms were able to be reduced with repeated extension. She was provided a HEP which she was able to properly demonstrate. She reported feeling comfortable with these interventions. Patient continues to require skilled physical therapy to address her remaining impairments to return to her prior level of function.     OBJECTIVE IMPAIRMENTS: Abnormal gait, decreased activity tolerance, decreased endurance, decreased mobility, difficulty walking, decreased ROM, decreased strength, hypomobility, impaired flexibility, impaired tone, postural dysfunction, and pain.   ACTIVITY LIMITATIONS: lifting, bending, standing, stairs, transfers, and locomotion level  PARTICIPATION LIMITATIONS: cleaning, driving, shopping, community activity, and occupation  PERSONAL FACTORS: Past/current experiences, Profession, Time since onset of injury/illness/exacerbation, and 3+ comorbidities: Hypertension, neuropathy, and history of depression are also affecting patient's functional outcome.   REHAB POTENTIAL: Good  CLINICAL DECISION MAKING: Evolving/moderate complexity  EVALUATION COMPLEXITY: Moderate   GOALS: Goals reviewed with patient? Yes  SHORT TERM GOALS: Target  date: 03/30/24  Patient will be independent with her initial HEP.  Baseline: Goal status: INITIAL  2.  Patient will be able to complete her critical job demands without her familiar symptoms exceeding 8/10. Baseline:  Goal status: INITIAL  3.  Patient will improve her bilateral hip flexor strength to at least 4+/5 for improved function with her daily activities.  Baseline:  Goal status:  INITIAL  LONG TERM GOALS: Target date: 04/20/24  Patient will be independent with her advanced HEP.  Baseline:  Goal status: INITIAL  2.  Patient will improve her ODI score to 13/50 or less for improved perceived function with her daily activities.  Baseline:  Goal status: INITIAL  3.  Patient will be able to complete her critical job demands without her familiar symptoms exceeding 6/10. Baseline:  Goal status: INITIAL  4.  Patient will be able to walk with no significant gait deviations for improved community mobility.  Baseline:  Goal status: INITIAL  PLAN:  PT FREQUENCY: 1-2x/week  PT DURATION: 6 weeks  PLANNED INTERVENTIONS: 97164- PT Re-evaluation, 97750- Physical Performance Testing, 97110-Therapeutic exercises, 97530- Therapeutic activity, [id number]- Neuromuscular re-education, 97535- Self Care, [id number]- Manual therapy, [phone]- Gait training, [phone]- Electrical stimulation (unattended), [phone]- Traction (mechanical), Patient/Family education, Balance training, Stair training, Joint mobilization, Spinal mobilization, Cryotherapy, and Moist heat.  PLAN FOR NEXT SESSION: repeated extension, postural reeducation, and lumbar stabilization exercises   Lacinda JAYSON Fass, PT 03/09/2024, 11:41 AM

## 2024-03-11 ENCOUNTER — Ambulatory Visit (HOSPITAL_COMMUNITY)
Admission: RE | Admit: 2024-03-11 | Discharge: 2024-03-11 | Disposition: A | Discharge status: Home / Self Care | Source: Ambulatory Visit | Attending: Internal Medicine | Admitting: Internal Medicine

## 2024-03-11 DIAGNOSIS — Z1231 Encounter for screening mammogram for malignant neoplasm of breast: Secondary | ICD-10-CM | POA: Diagnosis not present

## 2024-03-15 ENCOUNTER — Ambulatory Visit (HOSPITAL_COMMUNITY): Admitting: Physical Therapy

## 2024-03-15 DIAGNOSIS — M25551 Pain in right hip: Secondary | ICD-10-CM

## 2024-03-15 DIAGNOSIS — M5459 Other low back pain: Secondary | ICD-10-CM

## 2024-03-15 NOTE — Therapy (Signed)
 OUTPATIENT PHYSICAL THERAPY TREATMENT  Patient Name: Lisa Collins MRN: [id number] DOB:1955/09/12, 68 y.o., female Today's Date: 03/15/2024  END OF SESSION:  PT End of Session - 03/15/24 0955     Visit Number 2    Number of Visits 12    Date for Recertification  05/28/24    Authorization Type [employer]    Authorization Time Period no auth required    Progress Note Due on Visit 10    PT Start Time 0955    PT Stop Time 1030    PT Time Calculation (min) 35 min    Activity Tolerance Patient tolerated treatment well    Behavior During Therapy [facility] for tasks assessed/performed          No past medical history on file. No past surgical history on file. 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   Prediabetes 07/23/2021   Major depressive disorder 01/24/2021   Hyperkalemia 11/20/2020   Obesity 11/20/2020   Persistent cough 11/20/2020   Essential hypertension 11/13/2020   Hyperlipidemia 11/13/2020   Generalized anxiety disorder 10/31/2020   Chronic pain syndrome 10/11/2020    PCP: Shona Norleen PEDLAR, MD  REFERRING PROVIDER: Margrette Taft BRAVO, MD   REFERRING DIAG: Acute right-sided low back pain without sciatica   Rationale for Evaluation and Treatment: Rehabilitation  THERAPY DIAG:  Other low back pain  Pain in right hip  ONSET DATE: over 6 months  SUBJECTIVE:                                                                                                                                                                                           SUBJECTIVE STATEMENT: Pt states her pain is currently 7/10 into Rt buttock around into hip and into her groin.  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    Evaluation:  Patient reports that she has right hip pain that radiates around to her groin. However, she was told that this pain was due to her back and not her hip. She has had multiple x-rays on her back and hip. Her pain started over 6 months with no known cause. However, it seems to be slowly getting worse especially when she stands for long periods. Her pain is always there, but it gets worse throughout the day. She notes that she will start to  shuffle her feet when her pain gets bad. She also has problems getting up from the floor when she cleans.   PERTINENT HISTORY:  Hypertension, neuropathy, and history of depression  PAIN:  Are you having pain? Yes: NPRS scale: Current: 7/10  Pain location: right hip and groin Pain description: constant, sharp Aggravating factors: prolonged standing (couple hours), sweeping, mopping, job demands Relieving factors: medication, sitting  PRECAUTIONS: None  RED FLAGS: None   WEIGHT BEARING RESTRICTIONS: No  FALLS:  Has patient fallen in last 6 months? No  LIVING ENVIRONMENT: Lives with: lives with their spouse Lives in: House/apartment Stairs: Yes: Internal: 16 steps; can reach both and External: 4-6 steps; none; internal steps are down to her laundry room  Has following equipment at home: Grab bars  OCCUPATION: customer host at [employer]   PLOF: Independent  PATIENT GOALS: reduced pain and be able to stand longer  NEXT MD VISIT: 06/09/23  OBJECTIVE:  Note: Objective measures were completed at Evaluation unless otherwise noted.  DIAGNOSTIC FINDINGS: 02/09/24 lumbar x-ray  Impression mild to moderate spondylosis L4-S1   PATIENT SURVEYS:  Modified Oswestry:  MODIFIED OSWESTRY DISABILITY SCALE  Date: 03/09/24 Score  Pain intensity 3 =  Pain medication provides me with moderate relief from pain.  2. Personal care (washing, dressing, etc.) 1 =  I can take care of myself normally, but it increases my pain.  3. Lifting  4 = I can lift only very light weights  4. Walking 3 =  Pain prevents me from walking more than  mile.  5. Sitting 0 =  I can sit in any chair as long as I like.  6. Standing 1 =  I can stand as long as I want but, it increases my pain.  7. Sleeping 1 = I can sleep well only by using pain medication.  8. Social Life 1 =  My social life is normal, but it increases my level of pain.  9. Traveling 4 = My pain restricts my travel to short necessary journeys under 1/2 hour.  10. Employment/ Homemaking 1 = My normal homemaking/job activities increase my pain, but I can still perform all that is required of me  Total 19/50   Interpretation of scores: Score Category Description  0-20% Minimal Disability The patient can cope with most living activities. Usually no treatment is indicated apart from advice on lifting, sitting and exercise  21-40% Moderate Disability The patient experiences more pain and difficulty with sitting, lifting and standing. Travel and social life are more difficult and they may be disabled from work. Personal care, sexual activity and sleeping are not grossly affected, and the patient can usually be managed by conservative means  41-60% Severe Disability Pain remains the main problem in this group, but activities of daily living are affected. These patients require a detailed investigation  61-80% Crippled Back pain impinges on all aspects of the patient's life. Positive intervention is required  81-100% Bed-bound  These patients are either bed-bound or exaggerating their symptoms  Bluford FORBES Zoe DELENA Karon DELENA, et al. Surgery versus conservative management of stable thoracolumbar fracture: the PRESTO feasibility RCT. [place] ([place]): [employer]; 2021 Nov. [facility] Technology Assessment, No. 25.62.) Appendix 3, Oswestry Disability Index category descriptors. Available from: FindJewelers.cz  Minimally Clinically Important Difference (MCID) =  12.8%  COGNITION: Overall cognitive status: Within functional limits for tasks assessed     SENSATION: Patient reports numbness in her fingertips.   POSTURE: No Significant postural limitations  PALPATION:  TTP: right lumbar paraspinals (familiar pain), QL (familiar pain)   LUMBAR JOINT MOBILITY:  L1-3: WFL and nonpainful  L3-5: hypomobile and familiar pain  LUMBAR ROM:   AROM eval  Flexion 50% limited; familiar pain  Extension WFL Repeated extension (x10): reduced familiar pain  Right lateral flexion 25% limited  Left lateral flexion 50% limited; painful   Right rotation 25% limited; familiar pain  Left rotation 25% limited; familiar pain   (Blank rows = not tested)  LOWER EXTREMITY ROM:  WFL for activities assessed  LOWER EXTREMITY MMT:    MMT Right eval Left eval  Hip flexion 4-/5: familiar pain 4/5; knee pain  Hip extension    Hip abduction    Hip adduction    Hip internal rotation    Hip external rotation    Knee flexion 4/5 4/5  Knee extension 4/5; knee pain  4/5  Ankle dorsiflexion 4/5 4/5  Ankle plantarflexion    Ankle inversion    Ankle eversion     (Blank rows = not tested)  GAIT: Assistive device utilized: None Level of assistance: Complete Independence Comments: decreased stride length with poor hip extension and toe off bilaterally  TREATMENT DATE:                                                                                                                               03/15/24 Goal review HEP review (thoracic open book, standing lumbar extension) Hip excursions 5X each direction Seated: piriformis stretch 3X30 each Supine:  piriforms stretch 3X30 Rt only  Bridge 10X  03/09/24:  evaluation   PATIENT EDUCATION:  Education details: POC, HEP, prognosis, objective findings, and goals for physical therapy Person educated: Patient Education method: Explanation, Demonstration, and Handouts Education comprehension: verbalized  understanding and returned demonstration  HOME EXERCISE PROGRAM: Access Code: [email or web address] URL: [email or web address] Date: 03/09/2024 Prepared by: Lacinda Fass  Exercises - Standing Thoracic Open Book at Wall  - 2-3 x daily - 7 x weekly - 2 sets - 10 reps - Standing Lumbar Extension at Wall - Forearms  - 2-3 x daily - 7 x weekly - 2 sets - 10 reps  Access Code: [email or web address] URL: [email or web address] Date: 03/15/2024 Prepared by: Greig Fuse Exercises - Seated Figure 4 Piriformis Stretch  - 2 x daily - 7 x weekly - 3 reps - 30 sec hold - Supine Piriformis Stretch with Foot on Ground  - 2 x daily - 7 x weekly - 3 reps - 30 sec hold - Supine Bridge  - 2 x daily - 7 x weekly - 10 reps Hip excursions 10X each  ASSESSMENT:  CLINICAL IMPRESSION: Patient with slightly late arrival but then at desk a while coming back into gym late.  Pt able to recall HEP and reviewed goals as made a evaluation.  Progressed with seated piriformis stretch and also shown in supine.  Bridge began for glut strength, which pt verbalized liking as could tell this was  helping.  Standing hip excursions instructed and informed she could do these while standing at work. Pt reported overall improvement of symptoms at end of session.  Updated HEP to include added exercises today.   Patient continues to require skilled physical therapy to address her remaining impairments to return to her prior level of function.     OBJECTIVE IMPAIRMENTS: Abnormal gait, decreased activity tolerance, decreased endurance, decreased mobility, difficulty walking, decreased ROM, decreased strength, hypomobility, impaired flexibility, impaired tone, postural dysfunction, and pain.   ACTIVITY LIMITATIONS: lifting, bending, standing, stairs, transfers, and locomotion level  PARTICIPATION LIMITATIONS: cleaning, driving, shopping, community activity, and occupation  PERSONAL FACTORS: Past/current experiences, Profession,  Time since onset of injury/illness/exacerbation, and 3+ comorbidities: Hypertension, neuropathy, and history of depression are also affecting patient's functional outcome.   REHAB POTENTIAL: Good  CLINICAL DECISION MAKING: Evolving/moderate complexity  EVALUATION COMPLEXITY: Moderate   GOALS: Goals reviewed with patient? Yes  SHORT TERM GOALS: Target date: 03/30/24  Patient will be independent with her initial HEP.  Baseline: Goal status: INITIAL  2.  Patient will be able to complete her critical job demands without her familiar symptoms exceeding 8/10. Baseline:  Goal status: INITIAL  3.  Patient will improve her bilateral hip flexor strength to at least 4+/5 for improved function with her daily activities.  Baseline:  Goal status: INITIAL  LONG TERM GOALS: Target date: 04/20/24  Patient will be independent with her advanced HEP.  Baseline:  Goal status: INITIAL  2.  Patient will improve her ODI score to 13/50 or less for improved perceived function with her daily activities.  Baseline:  Goal status: INITIAL  3.  Patient will be able to complete her critical job demands without her familiar symptoms exceeding 6/10. Baseline:  Goal status: INITIAL  4.  Patient will be able to walk with no significant gait deviations for improved community mobility.  Baseline:  Goal status: INITIAL  PLAN:  PT FREQUENCY: 1-2x/week  PT DURATION: 6 weeks  PLANNED INTERVENTIONS: 97164- PT Re-evaluation, 97750- Physical Performance Testing, 97110-Therapeutic exercises, 97530- Therapeutic activity, [id number]- Neuromuscular re-education, 97535- Self Care, 02859- Manual therapy, [id number]- Gait training, [phone]- Electrical stimulation (unattended), [phone]- Traction (mechanical), Patient/Family education, Balance training, Stair training, Joint mobilization, Spinal mobilization, Cryotherapy, and Moist heat.  PLAN FOR NEXT SESSION:  Continue with repeated extension, postural reeducation, and lumbar  stabilization exercises.  Progress functional strength and mobility.   Vivian No B, PTA 03/15/2024, 9:57 AM

## 2024-03-23 ENCOUNTER — Encounter (HOSPITAL_COMMUNITY): Payer: Self-pay

## 2024-03-23 ENCOUNTER — Ambulatory Visit (HOSPITAL_COMMUNITY)

## 2024-03-23 DIAGNOSIS — M25551 Pain in right hip: Secondary | ICD-10-CM

## 2024-03-23 DIAGNOSIS — M5459 Other low back pain: Secondary | ICD-10-CM | POA: Diagnosis not present

## 2024-03-23 NOTE — Therapy (Signed)
 OUTPATIENT PHYSICAL THERAPY TREATMENT  Patient Name: Lisa Collins MRN: [id number] DOB:[birth year], 68 y.o., female Today's Date: 03/23/2024  END OF SESSION:  PT End of Session - 03/23/24 0953     Visit Number 3    Number of Visits 12    Date for Recertification  05/28/24    Authorization Type [employer]    Authorization Time Period no auth required    Progress Note Due on Visit 10    PT Start Time [phone]   Patient arrived late to her appointment.   PT Stop Time 1029    PT Time Calculation (min) 35 min    Activity Tolerance Patient tolerated treatment well    Behavior During Therapy [facility] for tasks assessed/performed           History reviewed. No pertinent past medical history. History reviewed. No pertinent surgical history. 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   Prediabetes 07/23/2021   Major depressive disorder 01/24/2021   Hyperkalemia 11/20/2020   Obesity 11/20/2020   Persistent cough 11/20/2020   Essential hypertension 11/13/2020   Hyperlipidemia 11/13/2020   Generalized anxiety disorder 10/31/2020   Chronic pain syndrome 10/11/2020    PCP: Shona Norleen PEDLAR, MD  REFERRING PROVIDER: Margrette Taft BRAVO, MD   REFERRING DIAG: Acute right-sided low back pain without sciatica   Rationale for Evaluation and Treatment: Rehabilitation  THERAPY DIAG:  Other low back pain  Pain in right hip  ONSET DATE: over 6 months  SUBJECTIVE:                                                                                                                                                                                           SUBJECTIVE STATEMENT: Patient reports that she was hurting really bad last night, but she put a towel under her knees and it  relieved a lot of her tension. She just woke up this morning and she is hurting a little bit. She felt a little better after her last appointment.    Evaluation:  Patient reports that she has right hip pain that radiates around to her groin. However, she was told that this pain was due to her back and not her hip. She has had multiple x-rays on her back and hip. Her pain started over 6 months with no known cause. However, it seems to be slowly getting worse especially when she stands for long periods. Her  pain is always there, but it gets worse throughout the day. She notes that she will start to shuffle her feet when her pain gets bad. She also has problems getting up from the floor when she cleans.   PERTINENT HISTORY:  Hypertension, neuropathy, and history of depression  PAIN:  Are you having pain? Yes: NPRS scale: Current: 5/10  Pain location: right hip and groin Pain description: constant, sharp Aggravating factors: prolonged standing (couple hours), sweeping, mopping, job demands Relieving factors: medication, sitting  PRECAUTIONS: None  RED FLAGS: None   WEIGHT BEARING RESTRICTIONS: No  FALLS:  Has patient fallen in last 6 months? No  LIVING ENVIRONMENT: Lives with: lives with their spouse Lives in: House/apartment Stairs: Yes: Internal: 16 steps; can reach both and External: 4-6 steps; none; internal steps are down to her laundry room  Has following equipment at home: Grab bars  OCCUPATION: customer host at [employer]   PLOF: Independent  PATIENT GOALS: reduced pain and be able to stand longer  NEXT MD VISIT: 06/09/23  OBJECTIVE:  Note: Objective measures were completed at Evaluation unless otherwise noted.  DIAGNOSTIC FINDINGS: 02/09/24 lumbar x-ray  Impression mild to moderate spondylosis L4-S1   PATIENT SURVEYS:  Modified Oswestry:  MODIFIED OSWESTRY DISABILITY SCALE  Date: 03/09/24 Score  Pain intensity 3 =  Pain medication provides me with moderate relief from  pain.  2. Personal care (washing, dressing, etc.) 1 =  I can take care of myself normally, but it increases my pain.  3. Lifting 4 = I can lift only very light weights  4. Walking 3 =  Pain prevents me from walking more than  mile.  5. Sitting 0 =  I can sit in any chair as long as I like.  6. Standing 1 =  I can stand as long as I want but, it increases my pain.  7. Sleeping 1 = I can sleep well only by using pain medication.  8. Social Life 1 =  My social life is normal, but it increases my level of pain.  9. Traveling 4 = My pain restricts my travel to short necessary journeys under 1/2 hour.  10. Employment/ Homemaking 1 = My normal homemaking/job activities increase my pain, but I can still perform all that is required of me  Total 19/50   Interpretation of scores: Score Category Description  0-20% Minimal Disability The patient can cope with most living activities. Usually no treatment is indicated apart from advice on lifting, sitting and exercise  21-40% Moderate Disability The patient experiences more pain and difficulty with sitting, lifting and standing. Travel and social life are more difficult and they may be disabled from work. Personal care, sexual activity and sleeping are not grossly affected, and the patient can usually be managed by conservative means  41-60% Severe Disability Pain remains the main problem in this group, but activities of daily living are affected. These patients require a detailed investigation  61-80% Crippled Back pain impinges on all aspects of the patient's life. Positive intervention is required  81-100% Bed-bound  These patients are either bed-bound or exaggerating their symptoms  Bluford FORBES Zoe DELENA Karon DELENA, et al. Surgery versus conservative management of stable thoracolumbar fracture: the PRESTO feasibility RCT. [place] ([place]): [employer]; 2021 Nov. [facility] Technology Assessment, No. 25.62.) Appendix 3, Oswestry Disability Index  category descriptors. Available from: FindJewel[employer].cz  Minimally Clinically Important Difference (MCID) = 12.8%  COGNITION: Overall cognitive status: Within functional limits for tasks assessed  SENSATION: Patient reports numbness in her fingertips.   POSTURE: No Significant postural limitations  PALPATION: TTP: right lumbar paraspinals (familiar pain), QL (familiar pain)   LUMBAR JOINT MOBILITY:  L1-3: WFL and nonpainful  L3-5: hypomobile and familiar pain  LUMBAR ROM:   AROM eval  Flexion 50% limited; familiar pain  Extension WFL Repeated extension (x10): reduced familiar pain  Right lateral flexion 25% limited  Left lateral flexion 50% limited; painful   Right rotation 25% limited; familiar pain  Left rotation 25% limited; familiar pain   (Blank rows = not tested)  LOWER EXTREMITY ROM:  WFL for activities assessed  LOWER EXTREMITY MMT:    MMT Right eval Left eval  Hip flexion 4-/5: familiar pain 4/5; knee pain  Hip extension    Hip abduction    Hip adduction    Hip internal rotation    Hip external rotation    Knee flexion 4/5 4/5  Knee extension 4/5; knee pain  4/5  Ankle dorsiflexion 4/5 4/5  Ankle plantarflexion    Ankle inversion    Ankle eversion     (Blank rows = not tested)  GAIT: Assistive device utilized: None Level of assistance: Complete Independence Comments: decreased stride length with poor hip extension and toe off bilaterally  TREATMENT DATE:                                            03/23/24 EXERCISE LOG  Exercise Repetitions and Resistance Comments  Bridge 12 reps   Lower trunk rotation  2 minutes   Supine piriformis stretch   2 minute  R hip only   Supine hip ADD isometric  2 minutes w/ 5 second hold   Bent knee fall out   20 reps each  Supine   Double knee to chest  2 minutes  LE supported on green ball   SLR  15 reps each    Nustep  L3 x 5 minutes @ seat 6        Blank cell = exercise not  performed today                                                                                                                        03/15/24 Goal review HEP review (thoracic open book, standing lumbar extension) Hip excursions 5X each direction Seated: piriformis stretch 3X30 each Supine:  piriforms stretch 3X30 Rt only  Bridge 10X  03/09/24:  evaluation   PATIENT EDUCATION:  Education details: healing, imaging, and OA  Person educated: Patient Education method: Explanation Education comprehension: verbalized understanding  HOME EXERCISE PROGRAM: Access Code: [email or web address] URL: [email or web address] Date: 03/09/2024 Prepared by: Lacinda Fass  Exercises - Standing Thoracic Open Book at Wall  - 2-3 x daily - 7 x weekly - 2 sets - 10 reps - Standing Lumbar Extension at Wall - Forearms  - 2-3 x  daily - 7 x weekly - 2 sets - 10 reps  Access Code: [email or web address] URL: [email or web address] Date: 03/15/2024 Prepared by: Greig Fuse Exercises - Seated Figure 4 Piriformis Stretch  - 2 x daily - 7 x weekly - 3 reps - 30 sec hold - Supine Piriformis Stretch with Foot on Ground  - 2 x daily - 7 x weekly - 3 reps - 30 sec hold - Supine Bridge  - 2 x daily - 7 x weekly - 10 reps Hip excursions 10X each  ASSESSMENT:  CLINICAL IMPRESSION: Patient was introduced to multiple new interventions for improved lumbopelvic strength and stability needed to facilitate improved gait mechanics. She required minimal cueing with straight leg raises for proper exercise performance to prevent compensatory movement patterns. She fatigued quickly with today's interventions, but a brief rest break between interventions prevented this from effecting follow up interventions. She reported that her back and hips felt better, but her knees were sore upon the conclusion of treatment. Patient continues to require skilled physical therapy to address her remaining impairments to return to her  prior level of function.    OBJECTIVE IMPAIRMENTS: Abnormal gait, decreased activity tolerance, decreased endurance, decreased mobility, difficulty walking, decreased ROM, decreased strength, hypomobility, impaired flexibility, impaired tone, postural dysfunction, and pain.   ACTIVITY LIMITATIONS: lifting, bending, standing, stairs, transfers, and locomotion level  PARTICIPATION LIMITATIONS: cleaning, driving, shopping, community activity, and occupation  PERSONAL FACTORS: Past/current experiences, Profession, Time since onset of injury/illness/exacerbation, and 3+ comorbidities: Hypertension, neuropathy, and history of depression are also affecting patient's functional outcome.   REHAB POTENTIAL: Good  CLINICAL DECISION MAKING: Evolving/moderate complexity  EVALUATION COMPLEXITY: Moderate   GOALS: Goals reviewed with patient? Yes  SHORT TERM GOALS: Target date: 03/30/24  Patient will be independent with her initial HEP.  Baseline: Goal status: INITIAL  2.  Patient will be able to complete her critical job demands without her familiar symptoms exceeding 8/10. Baseline:  Goal status: INITIAL  3.  Patient will improve her bilateral hip flexor strength to at least 4+/5 for improved function with her daily activities.  Baseline:  Goal status: INITIAL  LONG TERM GOALS: Target date: 04/20/24  Patient will be independent with her advanced HEP.  Baseline:  Goal status: INITIAL  2.  Patient will improve her ODI score to 13/50 or less for improved perceived function with her daily activities.  Baseline:  Goal status: INITIAL  3.  Patient will be able to complete her critical job demands without her familiar symptoms exceeding 6/10. Baseline:  Goal status: INITIAL  4.  Patient will be able to walk with no significant gait deviations for improved community mobility.  Baseline:  Goal status: INITIAL  PLAN:  PT FREQUENCY: 1-2x/week  PT DURATION: 6 weeks  PLANNED  INTERVENTIONS: 97164- PT Re-evaluation, 97750- Physical Performance Testing, 97110-Therapeutic exercises, 97530- Therapeutic activity, [id number]- Neuromuscular re-education, 97535- Self Care, 02859- Manual therapy, [phone]- Gait training, [phone]- Electrical stimulation (unattended), [id number]- Traction (mechanical), Patient/Family education, Balance training, Stair training, Joint mobilization, Spinal mobilization, Cryotherapy, and Moist heat.  PLAN FOR NEXT SESSION:  Continue with repeated extension, postural reeducation, and lumbar stabilization exercises.  Progress functional strength and mobility.   Lacinda JAYSON Fass, PT 03/23/2024, 12:19 PM

## 2024-03-25 ENCOUNTER — Ambulatory Visit (HOSPITAL_COMMUNITY)
Admission: RE | Admit: 2024-03-25 | Discharge: 2024-03-25 | Disposition: A | Discharge status: Home / Self Care | Payer: Self-pay | Source: Ambulatory Visit | Attending: Nurse Practitioner | Admitting: Nurse Practitioner

## 2024-03-25 DIAGNOSIS — E785 Hyperlipidemia, unspecified: Secondary | ICD-10-CM | POA: Insufficient documentation

## 2024-03-30 ENCOUNTER — Encounter (HOSPITAL_COMMUNITY)

## 2024-04-05 ENCOUNTER — Encounter (HOSPITAL_COMMUNITY)

## 2024-04-09 ENCOUNTER — Ambulatory Visit (HOSPITAL_COMMUNITY)

## 2024-04-12 ENCOUNTER — Encounter (HOSPITAL_COMMUNITY)

## 2024-04-19 ENCOUNTER — Ambulatory Visit (HOSPITAL_COMMUNITY): Attending: Orthopedic Surgery

## 2024-04-19 ENCOUNTER — Encounter (HOSPITAL_COMMUNITY): Payer: Self-pay

## 2024-04-19 DIAGNOSIS — M25551 Pain in right hip: Secondary | ICD-10-CM | POA: Insufficient documentation

## 2024-04-19 DIAGNOSIS — M5459 Other low back pain: Secondary | ICD-10-CM | POA: Diagnosis not present

## 2024-04-19 NOTE — Therapy (Signed)
 OUTPATIENT PHYSICAL THERAPY TREATMENT  Patient Name: Lisa Collins MRN: [id number] DOB:01/15/1956, 68 y.o., female Today's Date: 04/19/2024  END OF SESSION:  PT End of Session - 04/19/24 0949     Visit Number 4    Number of Visits 12    Date for Recertification  05/28/24    Authorization Type [employer]    Authorization Time Period no auth required    Progress Note Due on Visit 10    PT Start Time [id number]   Patient arrived late to her appointment.   PT Stop Time 1014    PT Time Calculation (min) 25 min    Activity Tolerance Patient tolerated treatment well    Behavior During Therapy [facility] for tasks assessed/performed            History reviewed. No pertinent past medical history. History reviewed. No pertinent surgical history. 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   Prediabetes 07/23/2021   Major depressive disorder 01/24/2021   Hyperkalemia 11/20/2020   Obesity 11/20/2020   Persistent cough 11/20/2020   Essential hypertension 11/13/2020   Hyperlipidemia 11/13/2020   Generalized anxiety disorder 10/31/2020   Chronic pain syndrome 10/11/2020    PCP: Shona Norleen PEDLAR, MD  REFERRING PROVIDER: Margrette Taft BRAVO, MD   REFERRING DIAG: Acute right-sided low back pain without sciatica   Rationale for Evaluation and Treatment: Rehabilitation  THERAPY DIAG:  Other low back pain  Pain in right hip  ONSET DATE: over 6 months  SUBJECTIVE:                                                                                                                                                                                           SUBJECTIVE STATEMENT: Patient reports that she is not feeling well today. Her back is also hurting. She would like for today  to be her last physical therapy appointment.   Evaluation:  Patient reports that she has right hip pain that radiates around to her groin. However, she was told that this pain was due to her back and not her hip. She has had multiple x-rays on her back and hip. Her pain started over 6 months with no known cause. However, it seems to be slowly getting worse especially when she stands for long periods. Her pain is always there, but it gets worse throughout the day. She notes that she will start to shuffle her feet when  her pain gets bad. She also has problems getting up from the floor when she cleans.   PERTINENT HISTORY:  Hypertension, neuropathy, and history of depression  PAIN:  Are you having pain? Yes: NPRS scale: Current: 7/10  Pain location: right hip and groin Pain description: constant, sharp Aggravating factors: prolonged standing (couple hours), sweeping, mopping, job demands Relieving factors: medication, sitting  PRECAUTIONS: None  RED FLAGS: None   WEIGHT BEARING RESTRICTIONS: No  FALLS:  Has patient fallen in last 6 months? No  LIVING ENVIRONMENT: Lives with: lives with their spouse Lives in: House/apartment Stairs: Yes: Internal: 16 steps; can reach both and External: 4-6 steps; none; internal steps are down to her laundry room  Has following equipment at home: Grab bars  OCCUPATION: customer host at [employer]   PLOF: Independent  PATIENT GOALS: reduced pain and be able to stand longer  NEXT MD VISIT: 06/09/23  OBJECTIVE:  Note: Objective measures were completed at Evaluation unless otherwise noted.  DIAGNOSTIC FINDINGS: 02/09/24 lumbar x-ray  Impression mild to moderate spondylosis L4-S1   PATIENT SURVEYS:  Modified Oswestry:  MODIFIED OSWESTRY DISABILITY SCALE  Date: 03/09/24 Score  Pain intensity 3 =  Pain medication provides me with moderate relief from pain.  2. Personal care (washing, dressing, etc.) 1 =  I can take care of myself normally, but it  increases my pain.  3. Lifting 4 = I can lift only very light weights  4. Walking 3 =  Pain prevents me from walking more than  mile.  5. Sitting 0 =  I can sit in any chair as long as I like.  6. Standing 1 =  I can stand as long as I want but, it increases my pain.  7. Sleeping 1 = I can sleep well only by using pain medication.  8. Social Life 1 =  My social life is normal, but it increases my level of pain.  9. Traveling 4 = My pain restricts my travel to short necessary journeys under 1/2 hour.  10. Employment/ Homemaking 1 = My normal homemaking/job activities increase my pain, but I can still perform all that is required of me  Total 19/50   Interpretation of scores: Score Category Description  0-20% Minimal Disability The patient can cope with most living activities. Usually no treatment is indicated apart from advice on lifting, sitting and exercise  21-40% Moderate Disability The patient experiences more pain and difficulty with sitting, lifting and standing. Travel and social life are more difficult and they may be disabled from work. Personal care, sexual activity and sleeping are not grossly affected, and the patient can usually be managed by conservative means  41-60% Severe Disability Pain remains the main problem in this group, but activities of daily living are affected. These patients require a detailed investigation  61-80% Crippled Back pain impinges on all aspects of the patient's life. Positive intervention is required  81-100% Bed-bound  These patients are either bed-bound or exaggerating their symptoms  Bluford FORBES Zoe DELENA Karon DELENA, et al. Surgery versus conservative management of stable thoracolumbar fracture: the PRESTO feasibility RCT. [place] ([place]): [employer]; 2021 Nov. [facility] Technology Assessment, No. 25.62.) Appendix 3, Oswestry Disability Index category descriptors. Available from: Findjewelers.cz  Minimally  Clinically Important Difference (MCID) = 12.8%  COGNITION: Overall cognitive status: Within functional limits for tasks assessed     SENSATION: Patient reports numbness in her fingertips.   POSTURE: No Significant postural limitations  PALPATION: TTP: right lumbar paraspinals (  familiar pain), QL (familiar pain)   LUMBAR JOINT MOBILITY:  L1-3: WFL and nonpainful  L3-5: hypomobile and familiar pain  LUMBAR ROM:   AROM eval  Flexion 50% limited; familiar pain  Extension WFL Repeated extension (x10): reduced familiar pain  Right lateral flexion 25% limited  Left lateral flexion 50% limited; painful   Right rotation 25% limited; familiar pain  Left rotation 25% limited; familiar pain   (Blank rows = not tested)  LOWER EXTREMITY ROM:  WFL for activities assessed  LOWER EXTREMITY MMT:    MMT Right eval Left eval  Hip flexion 4-/5: familiar pain 4/5; knee pain  Hip extension    Hip abduction    Hip adduction    Hip internal rotation    Hip external rotation    Knee flexion 4/5 4/5  Knee extension 4/5; knee pain  4/5  Ankle dorsiflexion 4/5 4/5  Ankle plantarflexion    Ankle inversion    Ankle eversion     (Blank rows = not tested)  GAIT: Assistive device utilized: None Level of assistance: Complete Independence Comments: decreased stride length with poor hip extension and toe off bilaterally  TREATMENT DATE:                                            04/19/24 EXERCISE LOG  Exercise Repetitions and Resistance Comments  Goal assessment  See below    Nustep  L3 x 5.5 minutes                  Blank cell = exercise not performed today                                    03/23/24 EXERCISE LOG  Exercise Repetitions and Resistance Comments  Bridge 12 reps   Lower trunk rotation  2 minutes   Supine piriformis stretch   2 minute  R hip only   Supine hip ADD isometric  2 minutes w/ 5 second hold   Bent knee fall out   20 reps each  Supine   Double knee to chest   2 minutes  LE supported on green ball   SLR  15 reps each    Nustep  L3 x 5 minutes @ seat 6        Blank cell = exercise not performed today                                                                                                                        03/15/24 Goal review HEP review (thoracic open book, standing lumbar extension) Hip excursions 5X each direction Seated: piriformis stretch 3X30 each Supine:  piriforms stretch 3X30 Rt only  Bridge 10X  03/09/24:  evaluation   PATIENT EDUCATION:  Education details: progress with physical  therapy.  Person educated: Patient Education method: Explanation Education comprehension: verbalized understanding  HOME EXERCISE PROGRAM: Access Code: [email or web address] URL: [email or web address] Date: 03/09/2024 Prepared by: Lacinda Fass  Exercises - Standing Thoracic Open Book at Wall  - 2-3 x daily - 7 x weekly - 2 sets - 10 reps - Standing Lumbar Extension at Wall - Forearms  - 2-3 x daily - 7 x weekly - 2 sets - 10 reps  Access Code: [email or web address] URL: [email or web address] Date: 03/15/2024 Prepared by: Greig Fuse Exercises - Seated Figure 4 Piriformis Stretch  - 2 x daily - 7 x weekly - 3 reps - 30 sec hold - Supine Piriformis Stretch with Foot on Ground  - 2 x daily - 7 x weekly - 3 reps - 30 sec hold - Supine Bridge  - 2 x daily - 7 x weekly - 10 reps Hip excursions 10X each  ASSESSMENT:  CLINICAL IMPRESSION: Patient presented to treatment and requested to be discharged at this time. Her goals were reviewed and she was unable to meet any of her short or long term goals. She was educated on the importance of continuing her HEP. She reported understanding and felt comfortable with these interventions. She requested to be discharged at this time.   PHYSICAL THERAPY DISCHARGE SUMMARY  Visits from Start of Care: 4  Current functional level related to goals / functional outcomes: Patient was unable to  meet her goals for physical therapy. She requested to be discharged today.    Remaining deficits: Pain, muscular strength, and gait deviations   Education / Equipment: HEP    Patient agrees to discharge. Patient goals were not met. Patient is being discharged due to the patient's request.   OBJECTIVE IMPAIRMENTS: Abnormal gait, decreased activity tolerance, decreased endurance, decreased mobility, difficulty walking, decreased ROM, decreased strength, hypomobility, impaired flexibility, impaired tone, postural dysfunction, and pain.   ACTIVITY LIMITATIONS: lifting, bending, standing, stairs, transfers, and locomotion level  PARTICIPATION LIMITATIONS: cleaning, driving, shopping, community activity, and occupation  PERSONAL FACTORS: Past/current experiences, Profession, Time since onset of injury/illness/exacerbation, and 3+ comorbidities: Hypertension, neuropathy, and history of depression are also affecting patient's functional outcome.   REHAB POTENTIAL: Good  CLINICAL DECISION MAKING: Evolving/moderate complexity  EVALUATION COMPLEXITY: Moderate   GOALS: Goals reviewed with patient? Yes  SHORT TERM GOALS: Target date: 03/30/24  Patient will be independent with her initial HEP.  Baseline: pt is only doing piriformis stretch  Goal status: PARTIALLY MET   2.  Patient will be able to complete her critical job demands without her familiar symptoms exceeding 8/10. Baseline: 8-9/10 Goal status: NOT MET  3.  Patient will improve her bilateral hip flexor strength to at least 4+/5 for improved function with her daily activities.  Baseline: R: 3+5; painful  L: 4-/5 Goal status: NOT MET  LONG TERM GOALS: Target date: 04/20/24  Patient will be independent with her advanced HEP.  Baseline:  Goal status: NOT MET  2.  Patient will improve her ODI score to 13/50 or less for improved perceived function with her daily activities.  Baseline: 20/50 Goal status: NOT MET   3.  Patient  will be able to complete her critical job demands without her familiar symptoms exceeding 6/10. Baseline:  Goal status: NOT MET  4.  Patient will be able to walk with no significant gait deviations for improved community mobility.  Baseline:  Goal status: NOT MET   PLAN:  PT FREQUENCY: 1-2x/week  PT DURATION: 6 weeks  PLANNED INTERVENTIONS: 02835- PT  Re-evaluation, 97750- Physical Performance Testing, 97110-Therapeutic exercises, 97530- Therapeutic activity, [id number]- Neuromuscular re-education, [phone]- Self Care, 02859- Manual therapy, [phone]- Gait training, [phone]- Electrical stimulation (unattended), [phone]- Traction (mechanical), Patient/Family education, Balance training, Stair training, Joint mobilization, Spinal mobilization, Cryotherapy, and Moist heat.  PLAN FOR NEXT SESSION:  Continue with repeated extension, postural reeducation, and lumbar stabilization exercises.  Progress functional strength and mobility.   Lacinda JAYSON Fass, PT 04/19/2024, 10:22 AM

## 2024-06-11 ENCOUNTER — Ambulatory Visit: Admitting: Cardiology

## 2024-06-11 ENCOUNTER — Ambulatory Visit: Attending: Cardiology | Admitting: Cardiology

## 2024-06-11 ENCOUNTER — Encounter: Payer: Self-pay | Admitting: Cardiology

## 2024-06-11 VITALS — BP 130/76 | HR 62 | Ht 65.0 in | Wt 178.0 lb

## 2024-06-11 DIAGNOSIS — R011 Cardiac murmur, unspecified: Secondary | ICD-10-CM

## 2024-06-11 DIAGNOSIS — I2510 Atherosclerotic heart disease of native coronary artery without angina pectoris: Secondary | ICD-10-CM

## 2024-06-11 DIAGNOSIS — E785 Hyperlipidemia, unspecified: Secondary | ICD-10-CM | POA: Diagnosis not present

## 2024-06-11 MED ORDER — ASPIRIN 81 MG PO TBEC: 81.0000 mg | DELAYED_RELEASE_TABLET | Freq: Every day | ORAL | Status: AC

## 2024-06-11 MED ORDER — ATORVASTATIN CALCIUM 40 MG PO TABS: 40.0000 mg | ORAL_TABLET | Freq: Every day | ORAL | Status: AC

## 2024-06-11 NOTE — Progress Notes (Signed)
 "     Clinical Summary Lisa Collins is a 69 y.o.female seen today as a new consult, referred by Dr Shona for the following medical problems.  1.HLD - 02/2024 TC 171 TG 70 HDL 60 LDL 98 -coronary calcium  score 03/2024: 756, 96th percentile EKG NSR, no ischemic changes  - no chest pains. No SOB/DOE - walks up flight of stairs regularly without exertional symptoms. Mopping/vaccuuming/sweeping without exertoinal symptoms    Works as orthoptist at [employer]   No past medical history on file.   Allergies[1]   Current Outpatient Medications  Medication Sig Dispense Refill   ALPRAZolam (XANAX) 0.25 MG tablet Take 0.25 mg by mouth at bedtime as needed for anxiety.     atorvastatin  (LIPITOR) 10 MG tablet Take 10 mg by mouth daily.     diclofenac (VOLTAREN) 75 MG EC tablet Take 75 mg by mouth 2 (two) times daily.     gabapentin (NEURONTIN) 300 MG capsule Take 300 mg by mouth 3 (three) times daily.     hydrochlorothiazide (HYDRODIURIL) 25 MG tablet Take 25 mg by mouth daily.     HYDROcodone -acetaminophen  (NORCO/VICODIN) 5-325 MG tablet Take 1 tablet by mouth every 4 (four) hours as needed. 15 tablet 0   methocarbamol  (ROBAXIN ) 500 MG tablet Take 1 tablet (500 mg total) by mouth 3 (three) times daily. 21 tablet 0   nabumetone (RELAFEN) 500 MG tablet Take 500 mg by mouth daily.     predniSONE (DELTASONE) 20 MG tablet Take 40 mg by mouth every morning.     sertraline (ZOLOFT) 100 MG tablet Take 100 mg by mouth daily.     Biotin w/ Vitamins C & E (HAIR/SKIN/NAILS PO) Take 1 tablet by mouth daily. (Patient not taking: Reported on 06/11/2024)     lidocaine (LIDODERM) 5 % Place 1 patch onto the skin daily. Remove & Discard patch within 12 hours or as directed by MD (Patient not taking: Reported on 06/11/2024)     No current facility-administered medications for this visit.     No past surgical history on file.   Allergies[2]    No family history on file.   Social History Lisa Collins  reports that she has been smoking e-cigarettes. She does not have any smokeless tobacco history on file. Lisa Collins reports no history of alcohol use.   Physical Examination Vitals:   06/11/24 1130  BP: 130/76  Pulse: 62  SpO2: 98%   Filed Weights   06/11/24 1130  Weight: 178 lb (80.7 kg)    Gen: resting comfortably, no acute distress HEENT: no scleral icterus, pupils equal round and reactive, no palptable cervical adenopathy,  CV: RRR, 2/6 systolic murmur rusb, no jvd Resp: Clear to auscultation bilaterally GI: abdomen is soft, non-tender, non-distended, normal bowel sounds, no hepatosplenomegaly MSK: extremities are warm, no edema.  Skin: warm, no rash Neuro:  no focal deficits Psych: appropriate affect   Assessment and Plan   1.Coronary atherosclerosis - elevated coronary calcium  score - in absence of symptoms would not plan for ischemic testing. Focus on aggressive risk factor modification - already on statin, add ASA 81mg  daily - EKG shows NSR, no ischemic changes  2. HLD - recent increase in her atorvastatin  to 40mg  by pcp, has repeat labs pending. Goal LDL would be <70  3.Heart murmur - obtain echo   Dorn PHEBE Ross, M.D.        [1]  Allergies Allergen Reactions   Sulfa Antibiotics Nausea And Vomiting  [2]  Allergies Allergen  Reactions   Sulfa Antibiotics Nausea And Vomiting   "

## 2024-06-11 NOTE — Patient Instructions (Signed)
 Medication Instructions:  Your physician has recommended you make the following change in your medication:   -Start Aspirin  81 mg once daily   *If you need a refill on your cardiac medications before your next appointment, please call your pharmacy*  Lab Work: None If you have labs (blood work) drawn today and your tests are completely normal, you will receive your results only by: MyChart Message (if you have MyChart) OR A paper copy in the mail If you have any lab test that is abnormal or we need to change your treatment, we will call you to review the results.  Testing/Procedures: Your physician has requested that you have an echocardiogram. Echocardiography is a painless test that uses sound waves to create images of your heart. It provides your doctor with information about the size and shape of your heart and how well your hearts chambers and valves are working. This procedure takes approximately one hour. There are no restrictions for this procedure. Please do NOT wear cologne, perfume, aftershave, or lotions (deodorant is allowed). Please arrive 15 minutes prior to your appointment time.  Please note: We ask at that you not bring children with you during ultrasound (echo/ vascular) testing. Due to room size and safety concerns, children are not allowed in the ultrasound rooms during exams. Our front office staff cannot provide observation of children in our lobby area while testing is being conducted. An adult accompanying a patient to their appointment will only be allowed in the ultrasound room at the discretion of the ultrasound technician under special circumstances. We apologize for any inconvenience.   Follow-Up: At [facility], you and your health needs are our priority.  As part of our continuing mission to provide you with exceptional heart care, our providers are all part of one team.  This team includes your primary Cardiologist (physician) and Advanced Practice  Providers or APPs (Physician Assistants and Nurse Practitioners) who all work together to provide you with the care you need, when you need it.  Your next appointment:   6 month(s)  Provider:   You may see Alvan Carrier, MD or one of the following Advanced Practice Providers on your designated Care Team:   Laymon Qua, PA-C  [place], [place] Olivia [place], [place]     We recommend signing up for the patient portal called MyChart.  Sign up information is provided on this After Visit Summary.  MyChart is used to connect with patients for Virtual Visits (Telemedicine).  Patients are able to view lab/test results, encounter notes, upcoming appointments, etc.  Non-urgent messages can be sent to your provider as well.   To learn more about what you can do with MyChart, go to [email or web address].   Other Instructions

## 2024-07-13 ENCOUNTER — Ambulatory Visit (HOSPITAL_COMMUNITY)
